# Patient Record
Sex: Female | Born: 1960 | Race: White | Hispanic: No | Marital: Married | Smoking: Former smoker
Health system: Southern US, Community
[De-identification: ages and names within clinical notes are randomized; demographics above are authoritative.]

## PROBLEM LIST (undated history)

## (undated) DIAGNOSIS — G629 Polyneuropathy, unspecified: Secondary | ICD-10-CM

## (undated) DIAGNOSIS — Z9181 History of falling: Secondary | ICD-10-CM

## (undated) DIAGNOSIS — T7840XA Allergy, unspecified, initial encounter: Secondary | ICD-10-CM

## (undated) DIAGNOSIS — F419 Anxiety disorder, unspecified: Secondary | ICD-10-CM

## (undated) DIAGNOSIS — H9190 Unspecified hearing loss, unspecified ear: Secondary | ICD-10-CM

## (undated) DIAGNOSIS — J449 Chronic obstructive pulmonary disease, unspecified: Secondary | ICD-10-CM

## (undated) HISTORY — DX: Polyneuropathy, unspecified: G62.9

## (undated) HISTORY — DX: Allergy, unspecified, initial encounter: T78.40XA

## (undated) HISTORY — PX: ABDOMINAL HYSTERECTOMY: SHX81

## (undated) HISTORY — DX: History of falling: Z91.81

## (undated) HISTORY — DX: Chronic obstructive pulmonary disease, unspecified: J44.9

## (undated) HISTORY — DX: Unspecified hearing loss, unspecified ear: H91.90

## (undated) HISTORY — DX: Anxiety disorder, unspecified: F41.9

---

## 2009-02-27 ENCOUNTER — Inpatient Hospital Stay: Payer: Self-pay | Admitting: Internal Medicine

## 2009-04-30 ENCOUNTER — Ambulatory Visit: Payer: Self-pay | Admitting: Internal Medicine

## 2009-12-01 ENCOUNTER — Emergency Department: Payer: Self-pay | Admitting: Emergency Medicine

## 2010-10-15 ENCOUNTER — Ambulatory Visit: Payer: Self-pay | Admitting: Internal Medicine

## 2011-04-05 ENCOUNTER — Inpatient Hospital Stay: Payer: Self-pay | Admitting: Internal Medicine

## 2011-04-07 DIAGNOSIS — R9431 Abnormal electrocardiogram [ECG] [EKG]: Secondary | ICD-10-CM

## 2012-04-30 ENCOUNTER — Emergency Department: Payer: Self-pay | Admitting: Emergency Medicine

## 2012-04-30 LAB — URINALYSIS, COMPLETE
Glucose,UR: NEGATIVE mg/dL (ref 0–75)
Ketone: NEGATIVE
Leukocyte Esterase: NEGATIVE
Nitrite: NEGATIVE
Protein: NEGATIVE
RBC,UR: <1 /HPF (ref 0–5)
WBC UR: NONE SEEN /HPF (ref 0–5)

## 2012-04-30 LAB — COMPREHENSIVE METABOLIC PANEL
Albumin: 3.7 g/dL (ref 3.4–5.0)
Anion Gap: 6 — ABNORMAL LOW (ref 7–16)
Bilirubin,Total: 0.3 mg/dL (ref 0.2–1.0)
Calcium, Total: 8.7 mg/dL (ref 8.5–10.1)
Chloride: 110 mmol/L — ABNORMAL HIGH (ref 98–107)
Co2: 29 mmol/L (ref 21–32)
Creatinine: 0.87 mg/dL (ref 0.60–1.30)
EGFR (African American): >60
EGFR (Non-African Amer.): >60
Glucose: 90 mg/dL (ref 65–99)
Osmolality: 289 (ref 275–301)
SGPT (ALT): 26 U/L (ref 12–78)
Sodium: 145 mmol/L (ref 136–145)

## 2012-04-30 LAB — CBC
HCT: 38.4 % (ref 35.0–47.0)
HGB: 13.3 g/dL (ref 12.0–16.0)
RDW: 13.1 % (ref 11.5–14.5)
WBC: 7.4 10*3/uL (ref 3.6–11.0)

## 2014-05-14 ENCOUNTER — Emergency Department: Payer: Self-pay | Admitting: Emergency Medicine

## 2014-05-14 LAB — CBC
HCT: 37.6 % (ref 35.0–47.0)
HGB: 12.5 g/dL (ref 12.0–16.0)
MCH: 30.8 pg (ref 26.0–34.0)
MCHC: 33.3 g/dL (ref 32.0–36.0)
MCV: 93 fL (ref 80–100)
Platelet: 273 10*3/uL (ref 150–440)
RBC: 4.07 10*6/uL (ref 3.80–5.20)
RDW: 13.2 % (ref 11.5–14.5)
WBC: 8.8 10*3/uL (ref 3.6–11.0)

## 2014-05-14 LAB — BASIC METABOLIC PANEL
Anion Gap: 6 — ABNORMAL LOW (ref 7–16)
BUN: 13 mg/dL (ref 7–18)
Calcium, Total: 8.4 mg/dL — ABNORMAL LOW (ref 8.5–10.1)
Chloride: 108 mmol/L — ABNORMAL HIGH (ref 98–107)
Co2: 27 mmol/L (ref 21–32)
Creatinine: 0.93 mg/dL (ref 0.60–1.30)
Glucose: 107 mg/dL — ABNORMAL HIGH (ref 65–99)
OSMOLALITY: 282 (ref 275–301)
Potassium: 3.8 mmol/L (ref 3.5–5.1)
Sodium: 141 mmol/L (ref 136–145)

## 2014-05-14 LAB — TROPONIN I: Troponin-I: <0.02 ng/mL

## 2016-01-20 ENCOUNTER — Ambulatory Visit: Payer: Medicare Other | Attending: Obstetrics and Gynecology | Admitting: Physical Therapy

## 2016-01-20 DIAGNOSIS — M6281 Muscle weakness (generalized): Secondary | ICD-10-CM | POA: Insufficient documentation

## 2016-01-20 DIAGNOSIS — R262 Difficulty in walking, not elsewhere classified: Secondary | ICD-10-CM | POA: Insufficient documentation

## 2016-01-20 NOTE — Therapy (Signed)
[place] [facility] MAIN [facility] SERVICES [address] [place], [place], [address] Phone: [phone]   Fax:  [phone]  Physical Therapy Evaluation  Patient Details  Name: Sandra Keller MRN: [id number] Date of Birth: 05/11/1961 Referring Provider: Dr. Galen Manila  Encounter Date: 01/20/2016      PT End of Session - 01/20/16 0936    Visit Number 1   Number of Visits 13   Date for PT Re-Evaluation 02/17/16   Authorization Type 1/10   PT Start Time 0803   PT Stop Time 0900   PT Time Calculation (min) 57 min   Equipment Utilized During Treatment Gait belt   Activity Tolerance Patient tolerated treatment well   Behavior During Therapy [facility] for tasks assessed/performed      History reviewed. No pertinent past medical history.  History reviewed. No pertinent past surgical history.  There were no vitals filed for this visit.       Subjective Assessment - 01/20/16 0930    Subjective Pt reports having bil knee pain (R>L) for the last few years. In 2013 she fell when going down stairs which is when they found the RLE neuropathy. She reports falling about a month ago when she was mowing her lawn and stepped into a hole and lost her balance. She states she has fallen about 4 times in the last 6 months due to losing her balance and states that her R ankle feels weak, causing her to not be able to catch herself. She states she has a bicycle that she would like to ride for exercise but due to her balance she is unable to. She reports having bil carpal tunnel. She does c/o back pain and intermittent shooting pains which she is seeing a chiropractor for and since her last visit she has not had any radicular symptoms. She denies BLE n/t besides the neuropathy in RLE. 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 Pt c/o decreased mobility due to the knee pain.   Pertinent History peripheral neuropathy RLE, time since onset,  husband's caretaker   Limitations Lifting;Standing;Walking   How long can you walk comfortably? 15-20 mins   Patient Stated Goals to decrease pain and be able to exercise   Currently in Pain? Yes   Pain Score 5    Pain Location Knee   Pain Orientation Right;Left   Pain Descriptors / Indicators Aching   Pain Type Chronic pain   Pain Onset More than a month ago   Aggravating Factors  standing, walking, lifting, stairs,    Pain Relieving Factors ibuprofen, rest            [facility] PT Assessment - 01/20/16 0001    Assessment   Medical Diagnosis chronic B knee pain, unstable gait   Referring Provider Dr. Galen Manila   Onset Date/Surgical Date 01/20/12   Prior Therapy yes; BLE weakness, balance   Precautions   Precautions Fall   Balance Screen   Has the patient fallen in the past 6 months Yes   How many times? 4   Has the patient had a decrease in activity level because of a fear of falling?  Yes   Is the patient reluctant to leave their home because of a fear of falling?  Yes   Home Environment   Living Environment Private residence   Living Arrangements Spouse/significant other   Available Help at Discharge Family   Type of Home House   Home  Access Ramped entrance   Home Layout One level   Home Equipment Walker - 2 wheels;Cane - single point;Grab bars - toilet;Tub bench;Toilet riser;Bedside commode   Prior Function   Level of Independence Independent   Vocation On disability   Leisure ride bicycle,    Balance   Balance Assessed Yes   Standardized Balance Assessment   Standardized Balance Assessment Berg Balance Test;Five Times Sit to Stand;10 meter walk test   Berg Balance Test   Sit to Stand Able to stand without using hands and stabilize independently   Standing Unsupported Able to stand safely 2 minutes   Sitting with Back Unsupported but Feet Supported on Floor or Stool Able to sit safely and securely 2 minutes   Stand to Sit Sits safely with minimal use of hands    Transfers Able to transfer safely, minor use of hands   Standing Unsupported with Eyes Closed Able to stand 10 seconds safely   Standing Ubsupported with Feet Together Able to place feet together independently and stand 1 minute safely   From Standing, Reach Forward with Outstretched Arm Can reach forward >12 cm safely (5")   From Standing Position, Pick up Object from Floor Able to pick up shoe safely and easily   From Standing Position, Turn to Look Behind Over each Shoulder Looks behind from both sides and weight shifts well   Turn 360 Degrees Able to turn 360 degrees safely in 4 seconds or less   Standing Unsupported, Alternately Place Feet on Step/Stool Able to stand independently and safely and complete 8 steps in 20 seconds   Standing Unsupported, One Foot in Front Able to place foot tandem independently and hold 30 seconds   Standing on One Leg Able to lift leg independently and hold 5-10 seconds   Total Score 54       REFLEXES L3: 2+ LLE; 1+RLE S1-2: diminished BLE  SENSATION WNL LLE Light touch diminished RLE, L3-S2  AROM WNL BLE  MMT (on 0-5/5 scale)  Left Right  Hip flexion 4+ 4-  Hip extension 3 3  Hip IR/ER 4 / 4- 3+ / 3+  Hip abduction 4- 3+  Hip adduction 4- 4-  Knee flexion 4 4-  Knee extension 4+ 4-  Ankle DF 4 4   FUNCTIONAL MOVEMENT Able to sit <> stand with minimal use of hands Stairs not assessed this date Pt demonstrated decreased bil knee flexion when picking object up off floor  SPECIAL TESTS Ely's: +BLE Ober's: -BLE; slight increased tightness and tenderness during test BLE McMurray's test: -BLE Valgus test: -BLE  GAIT Bil knee valgus, decreased hip extension, decreased pelvic rotation, trendelenberg throughout gait; able to ambulate with no assistive device  OBSERVATION/PALPATION Increased soft tissue restrictions to bil glute max, med, ITB, medial HS, gastroc/soleus, quads Increased tenderness to palpation of bil glute max, med, ITB,  medial HS Decreased patellar mobility throughout RLE, medial/lateral > superior/inferior; pain with superior mobs; lateral patellar tracking with quad contraction; pain with AP pressure to patella  Outcome measures: BERG: 54/56, low risk for falls 5 x sit to stand: 16.06s; subjects that are <60 YO and took >10 sec are more likely to have balance dysfunction 10MWT: 0.1423m/s; limited community ambulator        PT Education - 01/20/16 0936    Education provided Yes   Education Details exam findings, POC   Person(s) Educated Patient   Comprehension Verbalized understanding             PT  Long Term Goals - 01/20/16 0944    PT LONG TERM GOAL #1   Title pt will have increased LEFS score by >9 points to indicate improved overall function and decreased pain.   Baseline 10/80   Time 6   Period Weeks   Status New   PT LONG TERM GOAL #2   Title pt will be independent with HEP to maximize overall function and decrease risk of reinjury.   Time 6   Period Weeks   Status New   PT LONG TERM GOAL #3   Title pt will have increased by >0.15 m/s to demonstrate improved function and community ambulation.   Baseline 0.23m/s   Time 6   Period Weeks   Status New   PT LONG TERM GOAL #4   Title pt will have improved 5xsit to stand to <15sec to demonstrate improved strength and overall function.   Baseline 16.06s   Time 6   Period Weeks   Status New               Plan - 01/20/16 0936    Clinical Impression Statement pt is 55 YO F with c/o bil knee pain, R>L, RLE peripheral neuropathy, and balance deficits. pt demonstrated weakness, increased soft tissue restrictions and tenderness to palpation of bil hips, knees, and ankles, R>L. pt functional movements are diminished as demonstrated by improper body mechanics with squatting (increased hip flexion, decrease knee flexion), and subjective c/o pain with stairs and walking long distances. pt also had patellar mobility deficits and  lateral tracking of RLE. pt also has deficits in gait due to peripheral neuropathy and weakness. pt's BERG balance indicated she was at no increased risk for falls, however, she has fallen frequently in the last 6 months. pt needs skilled PT intervention to address deficits in weakness, soft tissue restrictions, gait, and functional mobility in order to maximize overall function and decrease risk for falls.   Rehab Potential Fair   Clinical Impairments Affecting Rehab Potential comorbidities, time since onset, Bil knee pain   PT Frequency 2x / week   PT Duration 6 weeks   PT Treatment/Interventions ADLs/Self Care Home Management;Electrical Stimulation;Gait training;Stair training;Functional mobility training;Therapeutic activities;Therapeutic exercise;Balance training;Neuromuscular re-education;Patient/family education;Manual techniques;Passive range of motion;Dry needling;Taping   PT Next Visit Plan HEP, manual, strengthening   Consulted and Agree with Plan of Care Patient      Patient will benefit from skilled therapeutic intervention in order to improve the following deficits and impairments:  Abnormal gait, Decreased activity tolerance, Decreased endurance, Decreased mobility, Decreased strength, Difficulty walking, Impaired sensation, Increased muscle spasms, Improper body mechanics, Obesity, Pain, Postural dysfunction  Visit Diagnosis: Muscle weakness (generalized)  Difficulty in walking, not elsewhere classified     Problem List There are no active problems to display for this patient.  Jac Canavan, SPT  Jac Canavan 01/20/2016, 9:52 AM   This entire session was performed under direct supervision and direction of a licensed therapist/therapist assistant . I have personally read, edited and approve of the note as written.  Mindy Lucillie Garfinkel, PT, DPT  01/20/2016, 6:24 PM [phone]   [facility] MAIN [facility] SERVICES [address], [place], [address] Phone: [phone]   Fax:  [phone]  Name: ROSABELLA EDGIN MRN: [id number] Date of Birth: 08/09/1960

## 2016-01-22 ENCOUNTER — Ambulatory Visit: Payer: Medicare Other | Admitting: Physical Therapy

## 2016-01-22 DIAGNOSIS — M6281 Muscle weakness (generalized): Secondary | ICD-10-CM | POA: Diagnosis not present

## 2016-01-22 DIAGNOSIS — R262 Difficulty in walking, not elsewhere classified: Secondary | ICD-10-CM

## 2016-01-22 NOTE — Patient Instructions (Signed)
[email or web address] Standing hip abd, ext, march with RTB, 2x10, 1x/day  Mini squats, 2x10, 1x/day bil side stepping with RTB x 5 laps, 1x/day

## 2016-01-22 NOTE — Therapy (Signed)
[place] [facility] [address] [place], [place], [address] Phone: [phone]   Fax:  [phone]  Physical Therapy Treatment  Patient Details  Name: Sandra Keller MRN: [id number] Date of Birth: 1961-06-18 Referring Provider: Dr. Galen ManilaZeitler  Encounter Date: 01/22/2016      PT End of Session - 01/22/16 0813    Visit Number 2   Number of Visits 13   Date for PT Re-Evaluation 02/17/16   Authorization Type 2/10   PT Start Time 0805   PT Stop Time 0845   PT Time Calculation (min) 40 min   Equipment Utilized During Treatment Gait belt   Activity Tolerance Patient tolerated treatment well   Behavior [facility] for tasks assessed/performed      Past Medical History  Diagnosis Date  . Anxiety   . COPD (chronic obstructive pulmonary disease) (HCC)   . Allergy   . Peripheral neuropathy (HCC) RLE  . Hearing loss   . Risk for falls     History reviewed. No pertinent past surgical history.  There were no vitals filed for this visit.      Subjective Assessment - 01/22/16 0809    Subjective pt reports feeling a little stiff this morning after driving and sitting for a little while.    Pertinent History peripheral neuropathy RLE, time since onset, husband's caretaker   Limitations Lifting;Standing;Walking   How long can you walk comfortably? 15-20 mins   Patient Stated Goals to decrease pain and be able to exercise   Currently in Pain? Yes   Pain Score 8    Pain Location Knee   Pain Orientation Right;Left   Pain Descriptors / Indicators Stabbing   Pain Onset More than a month ago       Therex: HEP: standing hip abd, extension and march with RTB, 2x10 each; min verbal cues for proper technique Mini squats, 2x10; min verbal cues for proper technique Bil side stepping with RTB, x 5 laps in // bars; min verbal cues for proper technique Resisted walking 4-way, 7.5# x 3 laps each way Bil sidelying hip abd and  clamshells, 2x 10 each, min verbal cues for proper technique  Manual: L ITB IASTM with the stick x 8 min; pt reports decreased pain following       PT Education - 01/22/16 0813    Education provided Yes   Education Details HEP   Person(s) Educated Patient   Methods Explanation   Comprehension Verbalized understanding             PT Long Term Goals - 01/20/16 0944    PT LONG TERM GOAL #1   Title pt will have increased LEFS score by >9 points to indicate improved overall function and decreased pain.   Baseline 10/80   Time 6   Period Weeks   Status New   PT LONG TERM GOAL #2   Title pt will be independent with HEP to maximize overall function and decrease risk of reinjury.   Time 6   Period Weeks   Status New   PT LONG TERM GOAL #3   Title pt will have increased 10MWT by >0.15 m/s to demonstrate improved function and community ambulation.   Baseline 0.8540m/s   Time 6   Period Weeks   Status New   PT LONG TERM GOAL #4   Title pt will have improved 5xsit to stand to <15sec to demonstrate improved strength and overall function.   Baseline 16.06s  Time 6   Period Weeks   Status New               Plan - 01/22/16 8119    Clinical Impression Statement Pt reports to therapy with continued bil knee pain, L>R today. Pt tolerated LE strengthening exercises as well as manual techniques to L ITB, which pt responded well to and reported decreased pain following. Pt was given HEP of LE strengthening exercises. Pt continues to have deficits in strength and soft tissue restrictions and would benefit from R patellar mobs, soft tissue release of bil ITB and glutes. Pt needs continued skilled PT intervention to address remaining deficits in ordre to maximize overall function and decrease pain.   Rehab Potential Fair   Clinical Impairments Affecting Rehab Potential comorbidities, time since onset, Bil knee pain   PT Frequency 2x / week   PT Duration 6 weeks   PT  Treatment/Interventions ADLs/Self Care Home Management;Electrical Stimulation;Gait training;Stair training;Functional mobility training;Therapeutic activities;Therapeutic exercise;Balance training;Neuromuscular re-education;Patient/family education;Manual techniques;Passive range of motion;Dry needling;Taping   PT Next Visit Plan HEP, manual, strengthening   Consulted and Agree with Plan of Care Patient      Patient will benefit from skilled therapeutic intervention in order to improve the following deficits and impairments:  Abnormal gait, Decreased activity tolerance, Decreased endurance, Decreased mobility, Decreased strength, Difficulty walking, Impaired sensation, Increased muscle spasms, Improper body mechanics, Obesity, Pain, Postural dysfunction  Visit Diagnosis: Muscle weakness (generalized)  Difficulty in walking, not elsewhere classified     Problem List There are no active problems to display for this patient.  Jac Canavan, SPT  Jac Canavan 01/22/2016, 8:55 AM   This entire session was performed under direct supervision and direction of a licensed therapist/therapist assistant . I have personally read, edited and approve of the note as written.  Mindy Lucillie Garfinkel, PT, DPT  01/22/2016, 9:19 AM [phone]   [facility] MAIN [facility] [address] [place], [place], [address] Phone: [phone]   Fax:  [phone]  Name: Sandra Keller MRN: [id number] Date of Birth: [birth year]

## 2016-01-27 ENCOUNTER — Ambulatory Visit: Payer: Medicare Other | Admitting: Physical Therapy

## 2016-01-27 DIAGNOSIS — M6281 Muscle weakness (generalized): Secondary | ICD-10-CM | POA: Diagnosis not present

## 2016-01-27 DIAGNOSIS — R262 Difficulty in walking, not elsewhere classified: Secondary | ICD-10-CM

## 2016-01-27 NOTE — Therapy (Signed)
[place] [facility] MAIN [facility] SERVICES [address] [place], [place], [address] Phone: [phone]   Fax:  [phone]  Physical Therapy Treatment  Patient Details  Name: Sandra Keller MRN: [id number] Date of Birth: 1961-05-16 Referring Provider: Dr. Galen Manila  Encounter Date: 01/27/2016      PT End of Session - 01/27/16 0929    Visit Number 3   Number of Visits 13   Date for PT Re-Evaluation 02/17/16   Authorization Type 3/10   PT Start Time 0905   PT Stop Time 0945   PT Time Calculation (min) 40 min   Equipment Utilized During Treatment Gait belt   Activity Tolerance Patient tolerated treatment well   Behavior During Therapy [facility] for tasks assessed/performed      Past Medical History:  Diagnosis Date  . Allergy   . Anxiety   . COPD (chronic obstructive pulmonary disease) (HCC)   . Hearing loss   . Peripheral neuropathy (HCC) RLE  . Risk for falls     History reviewed. No pertinent surgical history.  There were no vitals filed for this visit.      Subjective Assessment - 01/27/16 0927    Subjective pt reports she felt good over the weekend but that both her knees are bothering her this morning, L>R.   Pertinent History peripheral neuropathy RLE, time since onset, husband's caretaker   Limitations Lifting;Standing;Walking   How long can you walk comfortably? 15-20 mins   Patient Stated Goals to decrease pain and be able to exercise   Currently in Pain? Yes   Pain Score 8    Pain Location Knee   Pain Orientation Right;Left   Pain Onset More than a month ago        Manual:  IASTM with the stick, bil ITB, VMO x 20 min; increased restrictions and pain (L>R); decreased pain to 2/10 following session Grade 3 R patellar mobs, superior and medially, 3x30 sec bouts each  Therex: Bil quad stretch in prone, 3x30 sec each; min verbal cues for proper technique Bil sidelying hip abd 3x10, and clamshells 2 x10 each; min verbal cues  for proper technique Bridging with 3 sec hold at top, 2x10; min verbal cues for proper technique         PT Education - 01/27/16 0928    Education provided Yes   Education Details HEP, exercise technique   Person(s) Educated Patient   Methods Explanation   Comprehension Verbalized understanding             PT Long Term Goals - 01/20/16 0944      PT LONG TERM GOAL #1   Title pt will have increased LEFS score by >9 points to indicate improved overall function and decreased pain.   Baseline 10/80   Time 6   Period Weeks   Status New     PT LONG TERM GOAL #2   Title pt will be independent with HEP to maximize overall function and decrease risk of reinjury.   Time 6   Period Weeks   Status New     PT LONG TERM GOAL #3   Title pt will have increased by >0.15 m/s to demonstrate improved function and community ambulation.   Baseline 0.32m/s   Time 6   Period Weeks   Status New     PT LONG TERM GOAL #4   Title pt will have improved 5xsit to stand to <15sec to demonstrate improved strength and overall function.  Baseline 16.06s   Time 6   Period Weeks   Status New               Plan - 01/27/16 0930    Clinical Impression Statement Pt continues to make progress towards goals. She continues to present with increased soft tissue restrictions of bil glutes, quads, ITB and tolerated manual techniques well, reporting decreased pain following. She continues with deficits in BLE strength as well as demonstrated by difficulty with table exercises. Pt needs continued skilled PT to maximize overall function and decrease pain.   Rehab Potential Fair   Clinical Impairments Affecting Rehab Potential comorbidities, time since onset, Bil knee pain   PT Frequency 2x / week   PT Duration 6 weeks   PT Treatment/Interventions ADLs/Self Care Home Management;Electrical Stimulation;Gait training;Stair training;Functional mobility training;Therapeutic activities;Therapeutic  exercise;Balance training;Neuromuscular re-education;Patient/family education;Manual techniques;Passive range of motion;Dry needling;Taping   PT Next Visit Plan HEP, manual, strengthening   Consulted and Agree with Plan of Care Patient      Patient will benefit from skilled therapeutic intervention in order to improve the following deficits and impairments:  Abnormal gait, Decreased activity tolerance, Decreased endurance, Decreased mobility, Decreased strength, Difficulty walking, Impaired sensation, Increased muscle spasms, Improper body mechanics, Obesity, Pain, Postural dysfunction  Visit Diagnosis: Muscle weakness (generalized)  Difficulty in walking, not elsewhere classified     Problem List There are no active problems to display for this patient.  Jac Canavan, SPT This entire session was performed under direct supervision and direction of a licensed therapist/therapist assistant . I have personally read, edited and approve of the note as written.  Brice,Carrie PT, DPT 01/27/2016, 10:23 AM  [place] [facility] MAIN [facility] SERVICES [address] [place], [place], [address] Phone: [phone]   Fax:  [phone]  Name: Sandra Keller MRN: [id number] Date of Birth: [birth year]

## 2016-01-29 ENCOUNTER — Ambulatory Visit: Payer: Medicare Other

## 2016-01-29 DIAGNOSIS — M6281 Muscle weakness (generalized): Secondary | ICD-10-CM | POA: Diagnosis not present

## 2016-01-29 DIAGNOSIS — R262 Difficulty in walking, not elsewhere classified: Secondary | ICD-10-CM

## 2016-01-29 NOTE — Therapy (Signed)
[facility] MAIN [facility] SERVICES [address] [place], [place], [address] Phone: [phone]   Fax:  [phone]  Physical Therapy Treatment  Patient Details  Name: Sandra Keller MRN: [id number] Date of Birth: 11-Apr-1961 Referring Provider: Dr. Galen Manila  Encounter Date: 01/29/2016      PT End of Session - 01/29/16 0921    Visit Number 4   Number of Visits 13   Date for PT Re-Evaluation 02/17/16   Authorization Type 4/10   PT Start Time 0900   PT Stop Time 0942   PT Time Calculation (min) 42 min   Equipment Utilized During Treatment Gait belt   Activity Tolerance Patient tolerated treatment well   Behavior During Therapy [facility] for tasks assessed/performed      Past Medical History:  Diagnosis Date  . Allergy   . Anxiety   . COPD (chronic obstructive pulmonary disease) (HCC)   . Hearing loss   . Peripheral neuropathy (HCC) RLE  . Risk for falls     History reviewed. No pertinent surgical history.  There were no vitals filed for this visit.      Subjective Assessment - 01/29/16 0918    Subjective Pt reports being compliant with HEP. She states that her knee pain was increased this morning when she woke up but following her chiropractor visit this morning prior to therapy, her pain has decreased some.   Pertinent History peripheral neuropathy RLE, time since onset, husband's caretaker   Limitations Lifting;Standing;Walking   How long can you walk comfortably? 15-20 mins   Patient Stated Goals to decrease pain and be able to exercise   Currently in Pain? Yes   Pain Score 5    Pain Location Knee   Pain Orientation Right;Left   Pain Descriptors / Indicators Sore   Pain Onset More than a month ago        Manual: IASTM to bil ITB with the stick x 15 min; pt reported feeling looser and decreased pain following  Therex: Bil quad stretch in prone, 3x30 sec each Fwd/lat step ups with 6" step with no UE support 2x10 each; min  verbal/tactile cues to decrease pelvic rotation with lateral step ups, R>L Bil hip hikes on 4" step with no UE support 2x10 each; min tactile cues to decrease pelvic rotation with R>L; attempted to perform heel taps but increased pt knee pain;  Sit <> stand with RTB around knees for decreased knee valgus, 2x10        PT Education - 01/29/16 0920    Education provided Yes   Education Details exercise technique, decrease knee valgus   Person(s) Educated Patient   Methods Explanation   Comprehension Verbalized understanding             PT Long Term Goals - 01/20/16 0944      PT LONG TERM GOAL #1   Title pt will have increased LEFS score by >9 points to indicate improved overall function and decreased pain.   Baseline 10/80   Time 6   Period Weeks   Status New     PT LONG TERM GOAL #2   Title pt will be independent with HEP to maximize overall function and decrease risk of reinjury.   Time 6   Period Weeks   Status New     PT LONG TERM GOAL #3   Title pt will have increased by >0.15 m/s to demonstrate improved function and community ambulation.   Baseline 0.67m/s  Time 6   Period Weeks   Status New     PT LONG TERM GOAL #4   Title pt will have improved 5xsit to stand to <15sec to demonstrate improved strength and overall function.   Baseline 16.06s   Time 6   Period Weeks   Status New               Plan - 01/29/16 0933    Clinical Impression Statement Pt continuing to make progress as demonstrated by decreased soft tissue restrictions, and subjective reports that she is able to clear low threshold in her house more easily now. She continues to demonstrate fatigue R>L, as well as continued BLE weakness. Her R knee pain is main limiting factor throughout exercises. Pt needs continued skilled PT intervention to maximize overall function.   Rehab Potential Fair   Clinical Impairments Affecting Rehab Potential comorbidities, time since onset, Bil knee pain    PT Frequency 2x / week   PT Duration 6 weeks   PT Treatment/Interventions ADLs/Self Care Home Management;Electrical Stimulation;Gait training;Stair training;Functional mobility training;Therapeutic activities;Therapeutic exercise;Balance training;Neuromuscular re-education;Patient/family education;Manual techniques;Passive range of motion;Dry needling;Taping   PT Next Visit Plan HEP, manual, strengthening   Consulted and Agree with Plan of Care Patient      Patient will benefit from skilled therapeutic intervention in order to improve the following deficits and impairments:  Abnormal gait, Decreased activity tolerance, Decreased endurance, Decreased mobility, Decreased strength, Difficulty walking, Impaired sensation, Increased muscle spasms, Improper body mechanics, Obesity, Pain, Postural dysfunction  Visit Diagnosis: Muscle weakness (generalized)  Difficulty in walking, not elsewhere classified     Problem List There are no active problems to display for this patient.  Jac Canavan, SPT This entire session was performed under direct supervision and direction of a licensed therapist/therapist assistant . I have personally read, edited and approve of the note as written. Carlyon Shadow. Tortorici, PT, DPT [phone]   Tortorici,Ashley 01/29/2016, 2:03 PM  [facility] MAIN [facility] SERVICES [address] [place], [place], [address] Phone: [phone]   Fax:  [phone]  Name: Sandra Keller MRN: [id number] Date of Birth: March 22, 1961

## 2016-02-03 ENCOUNTER — Ambulatory Visit: Payer: Medicare Other

## 2016-02-03 DIAGNOSIS — R262 Difficulty in walking, not elsewhere classified: Secondary | ICD-10-CM

## 2016-02-03 DIAGNOSIS — M6281 Muscle weakness (generalized): Secondary | ICD-10-CM

## 2016-02-03 NOTE — Therapy (Signed)
[facility] MAIN [facility] SERVICES [address] [place], [place], [address] Phone: [phone]   Fax:  [phone]  Physical Therapy Treatment  Patient Details  Name: Sandra Keller MRN: [id number] Date of Birth: 1960-12-11 Referring Provider: Dr. Galen Manila  Encounter Date: 02/03/2016      PT End of Session - 02/03/16 0955    Visit Number 5   Number of Visits 13   Date for PT Re-Evaluation 02/17/16   Authorization Type 5/10   PT Start Time 0938   PT Stop Time 1017   PT Time Calculation (min) 39 min   Equipment Utilized During Treatment Gait belt   Activity Tolerance Patient tolerated treatment well   Behavior During Therapy [facility] for tasks assessed/performed      Past Medical History:  Diagnosis Date  . Allergy   . Anxiety   . COPD (chronic obstructive pulmonary disease) (HCC)   . Hearing loss   . Peripheral neuropathy (HCC) RLE  . Risk for falls     History reviewed. No pertinent surgical history.  There were no vitals filed for this visit.      Subjective Assessment - 02/03/16 0953    Subjective Pt reports increased pain medially on L knee during WB. Otherwise, pt states her pain has decreased overall.   Pertinent History peripheral neuropathy RLE, time since onset, husband's caretaker   Limitations Lifting;Standing;Walking   How long can you walk comfortably? 15-20 mins   Patient Stated Goals to decrease pain and be able to exercise   Currently in Pain? Yes   Pain Score 6    Pain Location Knee   Pain Orientation Left   Pain Descriptors / Indicators Sharp   Pain Onset More than a month ago       Manual: Cross friction and muscle stripping to medial L knee, adductors insertion point x 12 min; pt reported decreased pain following treatment  Therex: Bil quad stretch in prone, 3x30 each Bridging with 3 sec hold at top, 3x10; min verbal cues for proper technique Resisted walking 4-way x 10# x 3 laps each; SBA, increased  difficulty with L side stepping Wall slides and sit (x5 sec hold) with theraball, 2x10 each; min verbal cues to increased weight shift over LLE        PT Education - 02/03/16 0954    Education provided Yes   Education Details soft tissue restrictions, exercise technique   Person(s) Educated Patient   Methods Explanation   Comprehension Verbalized understanding             PT Long Term Goals - 01/20/16 0944      PT LONG TERM GOAL #1   Title pt will have increased LEFS score by >9 points to indicate improved overall function and decreased pain.   Baseline 10/80   Time 6   Period Weeks   Status New     PT LONG TERM GOAL #2   Title pt will be independent with HEP to maximize overall function and decrease risk of reinjury.   Time 6   Period Weeks   Status New     PT LONG TERM GOAL #3   Title pt will have increased by >0.15 m/s to demonstrate improved function and community ambulation.   Baseline 0.33m/s   Time 6   Period Weeks   Status New     PT LONG TERM GOAL #4   Title pt will have improved 5xsit to stand to <15sec to  demonstrate improved strength and overall function.   Baseline 16.06s   Time 6   Period Weeks   Status New               Plan - 02/03/16 1017    Clinical Impression Statement Pt presents to therapy today with increased pain in L knee medially which was addressed with manual therapy. Pt had increased soft tissue restrictions of adductors which were tender to palpation. She   reported that her pain decreased following treatment. Pt continues to have deficits in hip strength but is making improvements as evidenced by her tolerance for progressed strengthening exercises. She needs continued skilled PT intervention to address remaining deficits to maximize overall function.   Rehab Potential Fair   Clinical Impairments Affecting Rehab Potential comorbidities, time since onset, Bil knee pain   PT Frequency 2x / week   PT Duration 6 weeks    PT Treatment/Interventions ADLs/Self Care Home Management;Electrical Stimulation;Gait training;Stair training;Functional mobility training;Therapeutic activities;Therapeutic exercise;Balance training;Neuromuscular re-education;Patient/family education;Manual techniques;Passive range of motion;Dry needling;Taping   PT Next Visit Plan HEP, manual, strengthening   Consulted and Agree with Plan of Care Patient      Patient will benefit from skilled therapeutic intervention in order to improve the following deficits and impairments:  Abnormal gait, Decreased activity tolerance, Decreased endurance, Decreased mobility, Decreased strength, Difficulty walking, Impaired sensation, Increased muscle spasms, Improper body mechanics, Obesity, Pain, Postural dysfunction  Visit Diagnosis: Muscle weakness (generalized)  Difficulty in walking, not elsewhere classified     Problem List There are no active problems to display for this patient.  Jac Canavan, SPT This entire session was performed under direct supervision and direction of a licensed therapist/therapist assistant . I have personally read, edited and approve of the note as written. Carlyon Shadow. Tortorici, PT, DPT [phone]   Tortorici,Ashley 02/03/2016, 5:43 PM  [place] [facility] MAIN [facility] SERVICES [address] [place], [place], [address] Phone: [phone]   Fax:  [phone]  Name: EMILEY TEDESCO MRN: [id number] Date of Birth: 1961/03/13

## 2016-02-05 ENCOUNTER — Ambulatory Visit: Payer: Medicare Other | Attending: Obstetrics and Gynecology

## 2016-02-05 DIAGNOSIS — M6281 Muscle weakness (generalized): Secondary | ICD-10-CM | POA: Diagnosis not present

## 2016-02-05 DIAGNOSIS — R262 Difficulty in walking, not elsewhere classified: Secondary | ICD-10-CM | POA: Diagnosis present

## 2016-02-05 NOTE — Therapy (Signed)
[facility] MAIN [facility] SERVICES [address] [place], [place], [address] Phone: [phone]   Fax:  [phone]  Physical Therapy Treatment  Patient Details  Name: Sandra Keller MRN: [id number] Date of Birth: 07-Jan-1961 Referring Provider: Dr. Galen Manila  Encounter Date: 02/05/2016      PT End of Session - 02/05/16 0944    Visit Number 6   Number of Visits 13   Date for PT Re-Evaluation 02/17/16   Authorization Type 6/10   PT Start Time 0935   PT Stop Time 1018   PT Time Calculation (min) 43 min   Equipment Utilized During Treatment Gait belt   Activity Tolerance Patient tolerated treatment well   Behavior During Therapy [facility] for tasks assessed/performed      Past Medical History:  Diagnosis Date  . Allergy   . Anxiety   . COPD (chronic obstructive pulmonary disease) (HCC)   . Hearing loss   . Peripheral neuropathy (HCC) RLE  . Risk for falls     History reviewed. No pertinent surgical history.  There were no vitals filed for this visit.      Subjective Assessment - 02/05/16 0943    Subjective Pt reports that she feels her RLE has improved a lot. She reports continued medial knee pain on LLE this date but not as bad as last session.   Pertinent History peripheral neuropathy RLE, time since onset, husband's caretaker   Limitations Lifting;Standing;Walking   How long can you walk comfortably? 15-20 mins   Patient Stated Goals to decrease pain and be able to exercise   Currently in Pain? Yes   Pain Score 6    Pain Location Knee   Pain Orientation Left   Pain Descriptors / Indicators Sore   Pain Onset More than a month ago     Therex: Fwd/lat step ups on 6" step with airex, 2x10 each; min verbal cues to decrease pelvic rotation with L lateral step ups Lunges on long airex pad attempted but increased L knee pain so did not complete exercise Fwd/retro monster walks with RTB, 50ft x 5 laps; min verbal cues for proper  technique Mini squats with 7# DB, 2x10; pt reported no knee pain; min verbal cues for proper technique Sit <> stand with eccentric lowering (5 sec) with 7# DB, 2x10; min verbal cues for proper technique  Manual: Cross friction massage to medial L knee adductor insertion x 8 min; pt reported decreased pain and stiffness following treatment      PT Education - 02/05/16 0944    Education provided Yes   Education Details exercise technique, HEP   Person(s) Educated Patient   Methods Explanation   Comprehension Verbalized understanding             PT Long Term Goals - 01/20/16 0944      PT LONG TERM GOAL #1   Title pt will have increased LEFS score by >9 points to indicate improved overall function and decreased pain.   Baseline 10/80   Time 6   Period Weeks   Status New     PT LONG TERM GOAL #2   Title pt will be independent with HEP to maximize overall function and decrease risk of reinjury.   Time 6   Period Weeks   Status New     PT LONG TERM GOAL #3   Title pt will have increased by >0.15 m/s to demonstrate improved function and community ambulation.   Baseline 0.69m/s  Time 6   Period Weeks   Status New     PT LONG TERM GOAL #4   Title pt will have improved 5xsit to stand to <15sec to demonstrate improved strength and overall function.   Baseline 16.06s   Time 6   Period Weeks   Status New               Plan - 02/05/16 1215    Clinical Impression Statement Pt continuing to make progress towards goals as evidenced by decreased pain and improved BLE strength. Pt continues to c/o medial L knee pain, which was addressed with manual therapy following therex which helped alleviate pain. Pt reported joining a gym and asked which activities were appropriate for her to do. SPT educated pt that she could do/use anything that did not increase her back or BLE pain. Pt needs continued skilled PT intervention to maximize overall function.   Rehab Potential  Fair   Clinical Impairments Affecting Rehab Potential comorbidities, time since onset, Bil knee pain   PT Frequency 2x / week   PT Duration 6 weeks   PT Treatment/Interventions ADLs/Self Care Home Management;Electrical Stimulation;Gait training;Stair training;Functional mobility training;Therapeutic activities;Therapeutic exercise;Balance training;Neuromuscular re-education;Patient/family education;Manual techniques;Passive range of motion;Dry needling;Taping   PT Next Visit Plan HEP, manual, strengthening   Consulted and Agree with Plan of Care Patient      Patient will benefit from skilled therapeutic intervention in order to improve the following deficits and impairments:  Abnormal gait, Decreased activity tolerance, Decreased endurance, Decreased mobility, Decreased strength, Difficulty walking, Impaired sensation, Increased muscle spasms, Improper body mechanics, Obesity, Pain, Postural dysfunction  Visit Diagnosis: Muscle weakness (generalized)  Difficulty in walking, not elsewhere classified     Problem List There are no active problems to display for this patient.  Sandra Keller, SPT This entire session was performed under direct supervision and direction of a licensed therapist/therapist assistant . I have personally read, edited and approve of the note as written. Sandra Keller, PT, DPT [phone]  Keller,Sandra Keller 02/05/2016, 3:17 PM  [place] [facility] MAIN [facility] SERVICES [address] [place], [place], [address] Phone: [phone]   Fax:  [phone]  Name: Sandra Keller MRN: [id number] Date of Birth: 1961-06-14

## 2016-02-10 ENCOUNTER — Ambulatory Visit: Payer: Medicare Other

## 2016-02-10 DIAGNOSIS — R262 Difficulty in walking, not elsewhere classified: Secondary | ICD-10-CM

## 2016-02-10 DIAGNOSIS — M6281 Muscle weakness (generalized): Secondary | ICD-10-CM | POA: Diagnosis not present

## 2016-02-10 NOTE — Therapy (Signed)
[place] [facility] MAIN [facility] SERVICES [address] [place], [place], [address] Phone: [phone]   Fax:  [phone]  Physical Therapy Treatment  Patient Details  Name: Sandra Keller MRN: [id number] Date of Birth: October 27, 1960 Referring Provider: Dr. Galen Manila  Encounter Date: 02/10/2016      PT End of Session - 02/10/16 0959    Visit Number 7   Number of Visits 13   Date for PT Re-Evaluation 02/17/16   Authorization Type 6/10   PT Start Time 0939   PT Stop Time 1015   PT Time Calculation (min) 36 min   Equipment Utilized During Treatment Gait belt   Activity Tolerance Patient tolerated treatment well   Behavior During Therapy [facility] for tasks assessed/performed      Past Medical History:  Diagnosis Date  . Allergy   . Anxiety   . COPD (chronic obstructive pulmonary disease) (HCC)   . Hearing loss   . Peripheral neuropathy (HCC) RLE  . Risk for falls     History reviewed. No pertinent surgical history.  There were no vitals filed for this visit.      Subjective Assessment - 02/10/16 0958    Subjective Pt states she was partially compliant with HEP over the weekend and reports increased L knee pain this date.   Pertinent History peripheral neuropathy RLE, time since onset, husband's caretaker   Limitations Lifting;Standing;Walking   How long can you walk comfortably? 15-20 mins   Patient Stated Goals to decrease pain and be able to exercise   Currently in Pain? Yes   Pain Score 5    Pain Location Knee   Pain Orientation Left   Pain Descriptors / Indicators Sore   Pain Onset More than a month ago       Manual: Cross friction to medial L knee (adductors/HS insertion point); pt had increased soft tissue restrictions/taut band at medial knee; pt reported decreased pain following treatment; x 8 min  Therex: Hip hikes on stairs with BUE support, 3x10 each; min verbal/tactile cues for decrease pelvic rotation Resisted walking 4-way  x 12.5# x 3 laps each; pt demonstrated fatigue with R lateral walking Leg press 90#, 3x10 Wall slides and sits (5 sec hold) with bil 7# DB, 3x10 each; min verbal cues for proper technique; pt required increased rest breaks during wall sits        PT Education - 02/10/16 0959    Education provided Yes   Education Details exercise technique   Person(s) Educated Patient   Methods Explanation   Comprehension Verbalized understanding             PT Long Term Goals - 01/20/16 0944      PT LONG TERM GOAL #1   Title pt will have increased LEFS score by >9 points to indicate improved overall function and decreased pain.   Baseline 10/80   Time 6   Period Weeks   Status New     PT LONG TERM GOAL #2   Title pt will be independent with HEP to maximize overall function and decrease risk of reinjury.   Time 6   Period Weeks   Status New     PT LONG TERM GOAL #3   Title pt will have increased by >0.15 m/s to demonstrate improved function and community ambulation.   Baseline 0.70m/s   Time 6   Period Weeks   Status New     PT LONG TERM GOAL #4  Title pt will have improved 5xsit to stand to <15sec to demonstrate improved strength and overall function.   Baseline 16.06s   Time 6   Period Weeks   Status New               Plan - 02/10/16 1011    Clinical Impression Statement Treatment session limited this date due to pt being late to appointment. Pt continues with c/o medial L knee pain which was addressed with manual therapy and pt reported decrease in symptoms. Pt continues to demonstrate deficits in BLE strength and endurance and needs continued skilled PT intervention to maximize overall function.   Rehab Potential Fair   Clinical Impairments Affecting Rehab Potential comorbidities, time since onset, Bil knee pain   PT Frequency 2x / week   PT Duration 6 weeks   PT Treatment/Interventions ADLs/Self Care Home Management;Electrical Stimulation;Gait  training;Stair training;Functional mobility training;Therapeutic activities;Therapeutic exercise;Balance training;Neuromuscular re-education;Patient/family education;Manual techniques;Passive range of motion;Dry needling;Taping   PT Next Visit Plan HEP, manual, strengthening   Consulted and Agree with Plan of Care Patient      Patient will benefit from skilled therapeutic intervention in order to improve the following deficits and impairments:  Abnormal gait, Decreased activity tolerance, Decreased endurance, Decreased mobility, Decreased strength, Difficulty walking, Impaired sensation, Increased muscle spasms, Improper body mechanics, Obesity, Pain, Postural dysfunction  Visit Diagnosis: Muscle weakness (generalized)  Difficulty in walking, not elsewhere classified     Problem List There are no active problems to display for this patient.  Jac CanavanBrooke Elvert Cumpton, SPT This entire session was performed under direct supervision and direction of a licensed therapist/therapist assistant . I have personally read, edited and approve of the note as written. Carlyon ShadowAshley C. Tortorici, PT, DPT [phone]  Tortorici,Ashley 02/10/2016, 5:04 PM  [facility] [facility]IN [facility] [address] [place], [place], [id number] Phone: [phone]   Fax:  [phone]  Name: Sandra Keller MRN: [id number] Date of Birth: 1960/08/06

## 2016-02-12 ENCOUNTER — Ambulatory Visit: Payer: Medicare Other

## 2016-02-12 DIAGNOSIS — R262 Difficulty in walking, not elsewhere classified: Secondary | ICD-10-CM

## 2016-02-12 DIAGNOSIS — M6281 Muscle weakness (generalized): Secondary | ICD-10-CM

## 2016-02-12 NOTE — Therapy (Signed)
[place] [facility] MAIN [facility] SERVICES [address] [place], [place], [address] Phone: [phone]   Fax:  [phone]  Physical Therapy Treatment  Patient Details  Name: Sandra Keller MRN: [id number] Date of Birth: 1960-11-07 Referring Provider: Dr. Galen Manila  Encounter Date: 02/12/2016      PT End of Session - 02/12/16 1004    Visit Number 8   Number of Visits 13   Date for PT Re-Evaluation 02/17/16   Authorization Type 7/10   PT Start Time 0933   PT Stop Time 1017   PT Time Calculation (min) 44 min   Equipment Utilized During Treatment Gait belt   Activity Tolerance Patient tolerated treatment well   Behavior During Therapy [facility] for tasks assessed/performed      Past Medical History:  Diagnosis Date  . Allergy   . Anxiety   . COPD (chronic obstructive pulmonary disease) (HCC)   . Hearing loss   . Peripheral neuropathy (HCC) RLE  . Risk for falls     History reviewed. No pertinent surgical history.  There were no vitals filed for this visit.      Subjective Assessment - 02/12/16 1002    Subjective Pt reports continued L knee pain but states that it is not as bad as last treatment session.   Pertinent History peripheral neuropathy RLE, time since onset, husband's caretaker   Limitations Lifting;Standing;Walking   How long can you walk comfortably? 15-20 mins   Patient Stated Goals to decrease pain and be able to exercise   Currently in Pain? Yes   Pain Score 3    Pain Location Knee   Pain Orientation Left   Pain Descriptors / Indicators Sore   Pain Onset More than a month ago      Therex: Leg press 100# 1x15, 120# 2x15 Fwd/retro monster walks with GTB, 36ftx 5 laps; min verbal cues for proper technique Bil SLS with contralateral iso hip abd (x 3 sec) with ball against wall(Captain Lequita Halt pose), 2x10 each; min-mod verbal cues for proper technique and to decrease trendelenberg, L > R; BUE support for steadiness Fwd/lat step  ups on 6" step with GTB around BLE and contralateral march, 3x10 each; min verbal cues for proper technique, SBA Bil SLS with contralateral OH press with 4# DB, 3x10 each, min verbal cues for proper technique, min tactile cues for decrease trendelenberg bil (L>R) Mini squats with bil 9# DB, 1x20; min verbal cues for proper technique       PT Education - 02/12/16 1003    Education provided Yes   Education Details exercise technique, exercising at the gym, self soft tissue release   Person(s) Educated Patient   Methods Explanation   Comprehension Verbalized understanding             PT Long Term Goals - 01/20/16 0944      PT LONG TERM GOAL #1   Title pt will have increased LEFS score by >9 points to indicate improved overall function and decreased pain.   Baseline 10/80   Time 6   Period Weeks   Status New     PT LONG TERM GOAL #2   Title pt will be independent with HEP to maximize overall function and decrease risk of reinjury.   Time 6   Period Weeks   Status New     PT LONG TERM GOAL #3   Title pt will have increased by >0.15 m/s to demonstrate improved function and community ambulation.  Baseline 0.1859m/s   Time 6   Period Weeks   Status New     PT LONG TERM GOAL #4   Title pt will have improved 5xsit to stand to <15sec to demonstrate improved strength and overall function.   Baseline 16.06s   Time 6   Period Weeks   Status New               Plan - 02/12/16 1019    Clinical Impression Statement pt continuing to make progress towards goals. She tolerated progressed hip strengthening well but continues to demonstrate bil glute max and med deficits in strength and endurace. Pt inquired about gym exercises again and SPT educated pt that she could perform any of the machine weights as long as it does not increase her back or knee pain.   Rehab Potential Fair   Clinical Impairments Affecting Rehab Potential comorbidities, time since onset, Bil knee pain    PT Frequency 2x / week   PT Duration 6 weeks   PT Treatment/Interventions ADLs/Self Care Home Management;Electrical Stimulation;Gait training;Stair training;Functional mobility training;Therapeutic activities;Therapeutic exercise;Balance training;Neuromuscular re-education;Patient/family education;Manual techniques;Passive range of motion;Dry needling;Taping   PT Next Visit Plan HEP, manual, strengthening   Consulted and Agree with Plan of Care Patient      Patient will benefit from skilled therapeutic intervention in order to improve the following deficits and impairments:  Abnormal gait, Decreased activity tolerance, Decreased endurance, Decreased mobility, Decreased strength, Difficulty walking, Impaired sensation, Increased muscle spasms, Improper body mechanics, Obesity, Pain, Postural dysfunction  Visit Diagnosis: Muscle weakness (generalized)  Difficulty in walking, not elsewhere classified     Problem List There are no active problems to display for this patient.  Jac CanavanBrooke Jeffry Vogelsang, SPT This entire session was performed under direct supervision and direction of a licensed therapist/therapist assistant . I have personally read, edited and approve of the note as written. Carlyon ShadowAshley C. Tortorici, PT, DPT [phone]  Tortorici,Ashley 02/12/2016, 4:00 PM  [place] [facility] [address] [place], [place], [address] Phone: [phone]   Fax:  [phone]  Name: Ileene PatrickSherrie D Perriello MRN: [id number] Date of Birth: [birth year]

## 2016-02-17 ENCOUNTER — Ambulatory Visit: Payer: Medicare Other

## 2016-02-19 ENCOUNTER — Ambulatory Visit: Payer: Medicare Other

## 2016-03-02 ENCOUNTER — Ambulatory Visit: Payer: Medicare Other

## 2016-03-02 DIAGNOSIS — M6281 Muscle weakness (generalized): Secondary | ICD-10-CM | POA: Diagnosis not present

## 2016-03-02 DIAGNOSIS — R262 Difficulty in walking, not elsewhere classified: Secondary | ICD-10-CM

## 2016-03-02 NOTE — Therapy (Signed)
[place] [facility] [address] [place], [place], [address] Phone: [phone]   Fax:  [phone]  Physical Therapy Treatment  Patient Details  Name: Sandra PatrickSherrie D Cothern MRN: [id number] Date of Birth: 1960/09/26 Referring Provider: Dr. Galen ManilaZeitler  Encounter Date: 03/02/2016      PT End of Session - 03/02/16 0955    Visit Number 9   Number of Visits 25   Date for PT Re-Evaluation 04/13/16   Authorization Type 1/10   PT Start Time 0948   PT Stop Time 1015   PT Time Calculation (min) 27 min   Equipment Utilized During Treatment Gait belt   Activity Tolerance Patient tolerated treatment well   Behavior [facility] Therapy [facility] for tasks assessed/performed      Past Medical History:  Diagnosis Date  . Allergy   . Anxiety   . COPD (chronic obstructive pulmonary disease) (HCC)   . Hearing loss   . Peripheral neuropathy (HCC) RLE  . Risk for falls     No past surgical history on file.  There were no vitals filed for this visit.      Subjective Assessment - 03/02/16 0950    Subjective Pt states that she was has continued having bilateral knee pain. She has not been coming to therapy because the pain has been so bad. "I pretty much stayed in bed for the last 2 weeks." Pt states that she stopped doing all her exercises because her pain was so bad. She received cortisone shots in her L knee last week which has considerably improved her pain.    Pertinent History peripheral neuropathy RLE, time since onset, husband's caretaker   Limitations Lifting;Standing;Walking   How long can you walk comfortably? 15-20 mins   Patient Stated Goals to decrease pain and be able to exercise   Currently in Pain? Yes   Pain Score 1    Pain Location Knee   Pain Orientation Left   Pain Descriptors / Indicators Sore   Pain Type Chronic pain   Pain Onset More than a month ago   Aggravating Factors  standing, walking, lifting, stairs   Pain Relieving  Factors ibuprofen, rest            [facility] PT Assessment - 03/02/16 0001      ROM / Strength   AROM / PROM / Strength Strength     Strength   Strength Assessment Site Hip;Knee;Ankle   Right/Left Hip Right;Left   Right Hip Flexion 4/5   Right Hip Extension 4-/5   Right Hip External Rotation  4-/5   Right Hip Internal Rotation 4-/5   Right Hip ABduction 4-/5   Right Hip ADduction 4-/5   Left Hip Flexion 4/5   Left Hip Extension 4-/5   Left Hip External Rotation 4-/5   Left Hip Internal Rotation 4-/5   Left Hip ABduction 4-/5   Left Hip ADduction 4-/5   Right/Left Knee Right;Left   Right Knee Flexion 4+/5   Right Knee Extension 5/5   Left Knee Flexion 4+/5   Left Knee Extension 5/5   Right/Left Ankle Right;Left   Right Ankle Dorsiflexion 4+/5   Left Ankle Dorsiflexion 4+/5        TREATMENT  Ther-ex Scored and discussed LEFS results; Performed 5TSTS and 10MWT with patient; Manual muscle testing of LE (see above); Reviewed HEP with patient; Updated goals and discussed plan of care with patient;  PT Education - 03/02/16 [phone]    Education provided Yes   Education Details plan of care, goals updated, HEP reinforced   Person(s) Educated Patient   Methods Explanation   Comprehension Verbalized understanding             PT Long Term Goals - 03/02/16 0953      PT LONG TERM GOAL #1   Title pt will have increased LEFS score by >9 points to indicate improved overall function and decreased pain.   Baseline 10/80, 03/02/16: 55/80   Time 6   Period Weeks   Status Achieved     PT LONG TERM GOAL #2   Title pt will be independent with HEP to maximize overall function and decrease risk of reinjury.   Time 6   Period Weeks   Status On-going     PT LONG TERM GOAL #3   Title pt will have increased by >0.15 m/s to demonstrate improved function and community ambulation.   Baseline 0.30m/s, 03/02/16: 1.16 m/s   Time 6    Period Weeks   Status Achieved     PT LONG TERM GOAL #4   Title pt will have improved 5xsit to stand to <15sec to demonstrate improved strength and overall function.   Baseline 16.06s, 03/02/16: 10.07 seconds;   Time 6   Period Weeks   Status Achieved     PT LONG TERM GOAL #5   Title pt will have increased LEFS score by >9 points to indicate improved overall function and decreased pain.   Baseline 03/02/16: 55/80   Time 6   Period Weeks   Status New     Additional Long Term Goals   Additional Long Term Goals Yes     PT LONG TERM GOAL #6   Title Pt will improve bilateral hip strength by at least 1/2 MMT grade in all directions in order to provide better stability for knee kinematics   Baseline 03/02/16: 4/5 flexion, 4-/5 abduction, adduction, IR/ER, and extension;   Time 6   Period Weeks   Status New               Plan - 03/02/16 9604    Clinical Impression Statement Pt reports she essentially placed herself on bedrest over the last 2 weeks due to severe knee pain. She received cortisone shots in her L knee which has significantly improved her pain. Patient demonstrates significant improvement in and 5TSTS tests. She continues to demonstrate considerable bilateral hip weakness. She would benefit from skilled PT services to address remaining deficits in order to continue improving knee pain and LE function.    Rehab Potential Fair   Clinical Impairments Affecting Rehab Potential comorbidities, time since onset, Bil knee pain   PT Frequency 2x / week   PT Duration 6 weeks   PT Treatment/Interventions ADLs/Self Care Home Management;Electrical Stimulation;Gait training;Stair training;Functional mobility training;Therapeutic activities;Therapeutic exercise;Balance training;Neuromuscular re-education;Patient/family education;Manual techniques;Passive range of motion;Dry needling;Taping   PT Next Visit Plan Review HEP (pt encouraged to bring print-outs), discuss gym-based  program, manual for hip mobility, LE pain-free strengthening   PT Home Exercise Plan Standing hip abd, ext, march with RTB, 2x10, 1x/day, Mini squats, 2x10, 1x/day, bil side stepping with RTB x 5 laps, 1x/day    Consulted and Agree with Plan of Care Patient      Patient will benefit from skilled therapeutic intervention in order to improve the following deficits and impairments:  Abnormal gait, Decreased activity tolerance, Decreased endurance, Decreased  mobility, Decreased strength, Difficulty walking, Impaired sensation, Increased muscle spasms, Improper body mechanics, Obesity, Pain, Postural dysfunction  Visit Diagnosis: Muscle weakness (generalized) - Plan: PT plan of care cert/re-cert  Difficulty in walking, not elsewhere classified - Plan: PT plan of care cert/re-cert       G-Codes - March 25, 2016 1051    Functional Assessment Tool Used LEFS, 10 meter walk, 5x STS, MMT, clinical judgement   Functional Limitation Mobility: Walking and moving around   Mobility: Walking and Moving Around Current Status (Z6109) At least 20 percent but less than 40 percent impaired, limited or restricted   Mobility: Walking and Moving Around Goal Status (U0454) At least 1 percent but less than 20 percent impaired, limited or restricted      Problem List There are no active problems to display for this patient.  Lynnea Maizes PT, DPT   Huprich,Jason 2016-03-25, 11:01 AM  [place] [facility] MAIN [facility] SERVICES [address] [place], [place], [address] Phone: [phone]   Fax:  [phone]  Name: CORABELLE SPACKMAN MRN: [id number] Date of Birth: Nov 21, 1960

## 2016-03-04 ENCOUNTER — Ambulatory Visit: Payer: Medicare Other | Admitting: Physical Therapy

## 2016-03-04 VITALS — BP 108/61 | HR 89

## 2016-03-04 DIAGNOSIS — M6281 Muscle weakness (generalized): Secondary | ICD-10-CM | POA: Diagnosis not present

## 2016-03-04 DIAGNOSIS — R262 Difficulty in walking, not elsewhere classified: Secondary | ICD-10-CM

## 2016-03-04 NOTE — Therapy (Signed)
[facility] [address] [place], [place], [address] Phone: [phone]   Fax:  [phone]  Physical Therapy Treatment  Patient Details  Name: Sandra Keller MRN: [id number] Date of Birth: 08-May-1961 Referring Provider: Dr. Galen ManilaZeitler  Encounter Date: 03/04/2016      PT End of Session - 03/04/16 1034    Visit Number 10   Number of Visits 25   Date for PT Re-Evaluation 04/13/16   Authorization Type 2/10   PT Start Time 0942   PT Stop Time 1031   PT Time Calculation (min) 49 min   Activity Tolerance Patient tolerated treatment well   Behavior During Therapy [facility] for tasks assessed/performed      Past Medical History:  Diagnosis Date  . Allergy   . Anxiety   . COPD (chronic obstructive pulmonary disease) (HCC)   . Hearing loss   . Peripheral neuropathy (HCC) RLE  . Risk for falls     No past surgical history on file.  Vitals:   03/04/16 0951  BP: 108/61  Pulse: 89  SpO2: 97%        Subjective Assessment - 03/04/16 0949    Subjective Pt says her exercises at home are going well and is completing her HEP 1x/day.  Denies any pain currently.  Forgot to bring her HEP handouts in today but will bring them next session.  Pt is thinking about returning to [employer], say she does hip abd/add, leg, and the bicycle.   Pertinent History peripheral neuropathy RLE, time since onset, husband's caretaker   Limitations Lifting;Standing;Walking   How long can you walk comfortably? 15-20 mins   Patient Stated Goals to decrease pain and be able to exercise   Pain Onset More than a month ago      TREATMENT  Hooklying hip ER with RTB around knees 3x10  Sit<>stand with eccentric lowering with manual medial glide to patella (painful without manual assist) 2x10  Bridges with RTB around knees and cues for glute activation and even pelvis, 2x10  Sideways/forward/backward walking with RTB around knees with cues for slight  knee bend, 7510ft x2 each  Practiced exercises pt completes at gym (below):  Leg press 30# 1x10, 45# 1x10, 60# 2x10, cues for technique to not lock out knees into full extension  Forward/side stepping up to 8"step 2x10 each direction        PT Education - 03/04/16 0956    Education provided Yes   Education Details Exercise technique and purpose of each exercise.  Instructed pt to complete 1/2 of the exercises she typically does at the gym and assess knee soreness/pain after.   Person(s) Educated Patient   Methods Explanation;Verbal cues;Tactile cues   Comprehension Verbalized understanding;Returned demonstration             PT Long Term Goals - 03/02/16 0953      PT LONG TERM GOAL #1   Title pt will have increased LEFS score by >9 points to indicate improved overall function and decreased pain.   Baseline 10/80, 03/02/16: 55/80   Time 6   Period Weeks   Status Achieved     PT LONG TERM GOAL #2   Title pt will be independent with HEP to maximize overall function and decrease risk of reinjury.   Time 6   Period Weeks   Status On-going     PT LONG TERM GOAL #3   Title pt will have increased 10MWT by >0.15 m/s  to demonstrate improved function and community ambulation.   Baseline 0.7m/s, 03/02/16: 1.16 m/s   Time 6   Period Weeks   Status Achieved     PT LONG TERM GOAL #4   Title pt will have improved 5xsit to stand to <15sec to demonstrate improved strength and overall function.   Baseline 16.06s, 03/02/16: 10.07 seconds;   Time 6   Period Weeks   Status Achieved     PT LONG TERM GOAL #5   Title pt will have increased LEFS score by >9 points to indicate improved overall function and decreased pain.   Baseline 03/02/16: 55/80   Time 6   Period Weeks   Status New     Additional Long Term Goals   Additional Long Term Goals Yes     PT LONG TERM GOAL #6   Title Pt will improve bilateral hip strength by at least 1/2 MMT grade in all directions in order to provide  better stability for knee kinematics   Baseline 03/02/16: 4/5 flexion, 4-/5 abduction, adduction, IR/ER, and extension;   Time 6   Period Weeks   Status New               Plan - 03/04/16 1003    Clinical Impression Statement Discussed with pt the importance of easing into her routine at the gym and to assess for any soreness/pain after each gym session.  She was very motivated today and completed all exercises without pain. Pt will benefit from Bil LE strengthening, manual, and neuromuscular rehab interventions to advance gains already made with PT.   Rehab Potential Fair   Clinical Impairments Affecting Rehab Potential comorbidities, time since onset, Bil knee pain   PT Frequency 2x / week   PT Duration 6 weeks   PT Treatment/Interventions ADLs/Self Care Home Management;Electrical Stimulation;Gait training;Stair training;Functional mobility training;Therapeutic activities;Therapeutic exercise;Balance training;Neuromuscular re-education;Patient/family education;Manual techniques;Passive range of motion;Dry needling;Taping   PT Next Visit Plan Review HEP (pt encouraged to bring print-outs), discuss return to gym, manual for hip mobility, LE pain-free strengthening   PT Home Exercise Plan Standing hip abd, ext, march with RTB, 2x10, 1x/day, Mini squats, 2x10, 1x/day, bil side stepping with RTB x 5 laps, 1x/day    Consulted and Agree with Plan of Care Patient      Patient will benefit from skilled therapeutic intervention in order to improve the following deficits and impairments:  Abnormal gait, Decreased activity tolerance, Decreased endurance, Decreased mobility, Decreased strength, Difficulty walking, Impaired sensation, Increased muscle spasms, Improper body mechanics, Obesity, Pain, Postural dysfunction  Visit Diagnosis: Muscle weakness (generalized)  Difficulty in walking, not elsewhere classified     Problem List There are no active problems to display for this  patient.  Encarnacion Chu PT, DPT 03/04/2016, 10:36 AM  [place] [facility] MAIN [facility] SERVICES [address] [place], [place], [address] Phone: [phone]   Fax:  [phone]  Name: Sandra Keller MRN: [id number] Date of Birth: [birth year]

## 2016-03-11 ENCOUNTER — Ambulatory Visit: Payer: Medicare Other | Attending: Obstetrics and Gynecology | Admitting: Physical Therapy

## 2016-03-11 VITALS — BP 112/59 | HR 96

## 2016-03-11 DIAGNOSIS — R262 Difficulty in walking, not elsewhere classified: Secondary | ICD-10-CM | POA: Diagnosis present

## 2016-03-11 DIAGNOSIS — M6281 Muscle weakness (generalized): Secondary | ICD-10-CM | POA: Insufficient documentation

## 2016-03-11 NOTE — Therapy (Signed)
[facility] MAIN [facility] SERVICES [address] [place], [place], [address] Phone: [phone]   Fax:  [phone]  Physical Therapy Treatment  Patient Details  Name: Sandra Keller MRN: [id number] Date of Birth: [birth year] Referring Provider: Dr. Galen Manila  Encounter Date: 03/11/2016      PT End of Session - 03/11/16 1021    Visit Number 11   Number of Visits 25   Date for PT Re-Evaluation 04/13/16   Authorization Type 3/10   PT Start Time 0930   PT Stop Time 1016   PT Time Calculation (min) 46 min   Activity Tolerance Patient tolerated treatment well   Behavior During Therapy [facility] for tasks assessed/performed      Past Medical History:  Diagnosis Date  . Allergy   . Anxiety   . COPD (chronic obstructive pulmonary disease) (HCC)   . Hearing loss   . Peripheral neuropathy (HCC) RLE  . Risk for falls     History reviewed. No pertinent surgical history.  Vitals:   03/11/16 0935  BP: (!) 112/59  Pulse: 96  SpO2: 97%        Subjective Assessment - 03/11/16 0937    Subjective Pt arrived 15 minutes late.  Pt has not had time to go to the gym as time spent caregiving for her husband has increased over the past week.  She reports she has been doing her HEP every day or every other day.  Pt has brought in her HEP to review today.  Has been going on 25 minute walks with her dogs without pain and was encouraged that she walked from parking lot to appointment without pain.   Pertinent History peripheral neuropathy RLE, time since onset, husband's caretaker   Limitations Lifting;Standing;Walking   How long can you walk comfortably? 25 mins   Patient Stated Goals to decrease pain and be able to exercise   Currently in Pain? No/denies   Pain Onset --       TREATMENT   Therapeutic Exercise  Sit<>stand with eccentric lowering with RTB around knees 2x10  Sideways/forward/backward walking with RTB around knees with cues for slight knee  bend, 20ft x3 each  Standing hip abduction and hip extension with RTB around ankles 2x10 each side. Cues for posture with hip extension exercise to prevent lumbar lordosis.  Hooklying hip abd/ER with RTB around knees 2x10 with cues for glute activation   Manual:  Assessed Bil hip PROM F, IR, ER and noted L hip IR ~20 deg.  Lateral grade II L hip mobilization with belt with slight improvement in IR PROM.   Pt denies pain throughout duration of session.          PT Education - 03/11/16 0940    Education provided Yes   Education Details Reviewed HEP, exercise technique.   Person(s) Educated Patient   Methods Explanation;Verbal cues   Comprehension Verbalized understanding             PT Long Term Goals - 03/02/16 0953      PT LONG TERM GOAL #1   Title pt will have increased LEFS score by >9 points to indicate improved overall function and decreased pain.   Baseline 10/80, 03/02/16: 55/80   Time 6   Period Weeks   Status Achieved     PT LONG TERM GOAL #2   Title pt will be independent with HEP to maximize overall function and decrease risk of reinjury.   Time 6  Period Weeks   Status On-going     PT LONG TERM GOAL #3   Title pt will have increased 10MWT by >0.15 m/s to demonstrate improved function and community ambulation.   Baseline 0.2220m/s, 03/02/16: 1.16 m/s   Time 6   Period Weeks   Status Achieved     PT LONG TERM GOAL #4   Title pt will have improved 5xsit to stand to <15sec to demonstrate improved strength and overall function.   Baseline 16.06s, 03/02/16: 10.07 seconds;   Time 6   Period Weeks   Status Achieved     PT LONG TERM GOAL #5   Title pt will have increased LEFS score by >9 points to indicate improved overall function and decreased pain.   Baseline 03/02/16: 55/80   Time 6   Period Weeks   Status New     Additional Long Term Goals   Additional Long Term Goals Yes     PT LONG TERM GOAL #6   Title Pt will improve bilateral hip strength by  at least 1/2 MMT grade in all directions in order to provide better stability for knee kinematics   Baseline 03/02/16: 4/5 flexion, 4-/5 abduction, adduction, IR/ER, and extension;   Time 6   Period Weeks   Status New               Plan - 03/11/16 [id number]    Clinical Impression Statement Reviewed HEP with pt and pt denied any pain thorughout duration of session.  Has been completing her exercises at home and believes she is improving with PT and is able to walk 25 minutes without pain.  Noted to have decreased L hip IR compared to R and hip mobilization was performed today.  She tolerated all interventions well this date and denied pain.   Rehab Potential Fair   Clinical Impairments Affecting Rehab Potential comorbidities, time since onset, Bil knee pain   PT Frequency 2x / week   PT Duration 6 weeks   PT Treatment/Interventions ADLs/Self Care Home Management;Electrical Stimulation;Gait training;Stair training;Functional mobility training;Therapeutic activities;Therapeutic exercise;Balance training;Neuromuscular re-education;Patient/family education;Manual techniques;Passive range of motion;Dry needling;Taping   PT Next Visit Plan Review HEP (pt encouraged to bring print-outs), discuss return to gym, manual for hip mobility, LE pain-free strengthening   PT Home Exercise Plan Standing hip abd, ext, march with RTB, 2x10, 1x/day, Mini squats, 2x10, 1x/day, bil side stepping/forward/backward with RTB x 5 laps, 1x/day    Consulted and Agree with Plan of Care Patient      Patient will benefit from skilled therapeutic intervention in order to improve the following deficits and impairments:  Abnormal gait, Decreased activity tolerance, Decreased endurance, Decreased mobility, Decreased strength, Difficulty walking, Impaired sensation, Increased muscle spasms, Improper body mechanics, Obesity, Pain, Postural dysfunction  Visit Diagnosis: Muscle weakness (generalized)  Difficulty in walking, not  elsewhere classified     Problem List There are no active problems to display for this patient.   Encarnacion ChuAshley Akhila Mahnken PT, DPT 03/11/2016, 10:25 AM  [facility] [address] , [place], [address] Phone: [phone]   Fax:  [phone]  Name: Ileene PatrickSherrie D Jeancharles MRN: [id number] Date of Birth: [birth year]

## 2016-03-17 ENCOUNTER — Ambulatory Visit: Payer: Medicare Other | Admitting: Physical Therapy

## 2016-03-17 ENCOUNTER — Encounter: Payer: Self-pay | Admitting: Physical Therapy

## 2016-03-17 VITALS — BP 117/58 | HR 84

## 2016-03-17 DIAGNOSIS — M6281 Muscle weakness (generalized): Secondary | ICD-10-CM | POA: Diagnosis not present

## 2016-03-17 DIAGNOSIS — R262 Difficulty in walking, not elsewhere classified: Secondary | ICD-10-CM

## 2016-03-17 NOTE — Therapy (Signed)
[facility] MAIN [facility] SERVICES [address] [place], [place], [address] Phone: [phone]   Fax:  [phone]  Physical Therapy Treatment  Patient Details  Name: Sandra Keller MRN: [id number] Date of Birth: 1960/09/27 Referring Provider: Dr. Galen Manila  Encounter Date: 03/17/2016      PT End of Session - 03/17/16 1112    Visit Number 12   Number of Visits 25   Date for PT Re-Evaluation 04/13/16   Authorization Type 4/10   PT Start Time 1040   PT Stop Time 1115   PT Time Calculation (min) 35 min   Activity Tolerance Patient tolerated treatment well   Behavior During Therapy [facility] for tasks assessed/performed      Past Medical History:  Diagnosis Date  . Allergy   . Anxiety   . COPD (chronic obstructive pulmonary disease) (HCC)   . Hearing loss   . Peripheral neuropathy (HCC) RLE  . Risk for falls     History reviewed. No pertinent surgical history.  Vitals:   03/17/16 1046  BP: (!) 117/58  Pulse: 84  SpO2: 99%        Subjective Assessment - 03/17/16 1043    Subjective Pt arrived 10 minutes late.  She continues to be consumed by her husband who is, per pt, requiring more assist at home than usual.  Says she has not had any pain since her last PT session.  Walked 1.1 miles around her sister's neighborhood over the past weekend without pain, with mild SOB.  Completing HEP every other day.  No complaints or concerns this date.     Pertinent History peripheral neuropathy RLE, time since onset, husband's caretaker   Limitations Lifting;Standing;Walking   How long can you walk comfortably? 25 minutes; 1.1 miles   Patient Stated Goals to decrease pain and be able to exercise   Currently in Pain? No/denies        TREATMENT  Hip AROM measured in degrees before joint mobilizations (L,R):  F: 110, 118  Abd: 37, 36  IR: 30, 41  ER: 28, 26   Manual:  Lateral grade II L hip mobilization with belt with slight improvement in IR  PROM.  L Hip IR AROM measured in degrees after joint mobilizations: 42   Therapeutic Exercise  Leg press 105# x10, 120# x10, 135# x10  Sit<>stand with eccentric lowering with RTB around knees 2x15  Hooklying hip abd/ER with RTB around knees 2x15 with cues for glute activation  Sideways/forward/backward walking with RTB around knees with cues for slight knee bend, 67ft x3 each  Standing hip abduction and hip extension with RTB around ankles 2x10 each side. Cues for posture with hip extension exercise to prevent lumbar lordosis.  Pt denies pain throughout duration of session.           PT Education - 03/17/16 1047    Education provided Yes   Education Details Strongly encouraged pt to complete HEP 5 days/wk.  Exercise technique. Core activation with exercises.   Person(s) Educated Patient   Methods Explanation;Demonstration;Tactile cues;Verbal cues   Comprehension Verbalized understanding;Returned demonstration             PT Long Term Goals - 03/02/16 0953      PT LONG TERM GOAL #1   Title pt will have increased LEFS score by >9 points to indicate improved overall function and decreased pain.   Baseline 10/80, 03/02/16: 55/80   Time 6   Period Weeks   Status Achieved  PT LONG TERM GOAL #2   Title pt will be independent with HEP to maximize overall function and decrease risk of reinjury.   Time 6   Period Weeks   Status On-going     PT LONG TERM GOAL #3   Title pt will have increased 10MWT by >0.15 m/s to demonstrate improved function and community ambulation.   Baseline 0.6022m/s, 03/02/16: 1.16 m/s   Time 6   Period Weeks   Status Achieved     PT LONG TERM GOAL #4   Title pt will have improved 5xsit to stand to <15sec to demonstrate improved strength and overall function.   Baseline 16.06s, 03/02/16: 10.07 seconds;   Time 6   Period Weeks   Status Achieved     PT LONG TERM GOAL #5   Title pt will have increased LEFS score by >9 points to indicate improved  overall function and decreased pain.   Baseline 03/02/16: 55/80   Time 6   Period Weeks   Status New     Additional Long Term Goals   Additional Long Term Goals Yes     PT LONG TERM GOAL #6   Title Pt will improve bilateral hip strength by at least 1/2 MMT grade in all directions in order to provide better stability for knee kinematics   Baseline 03/02/16: 4/5 flexion, 4-/5 abduction, adduction, IR/ER, and extension;   Time 6   Period Weeks   Status New               Plan - 03/17/16 1109    Clinical Impression Statement Pt has only been comleting HEP every other day so had discussion about pt completing HEP each day in addition to walking or going ot the gym for exercise.  Pt verbalized undertsanding of role of HEP for long term management and for improved strength and reduction of pain.  Will benefit from skilled PT to progress strengthening exercises.     Rehab Potential Fair   Clinical Impairments Affecting Rehab Potential comorbidities, time since onset, Bil knee pain   PT Frequency 2x / week   PT Duration 6 weeks   PT Treatment/Interventions ADLs/Self Care Home Management;Electrical Stimulation;Gait training;Stair training;Functional mobility training;Therapeutic activities;Therapeutic exercise;Balance training;Neuromuscular re-education;Patient/family education;Manual techniques;Passive range of motion;Dry needling;Taping   PT Next Visit Plan Review HEP (pt encouraged to bring print-outs), discuss return to gym, manual for hip mobility, LE pain-free strengthening   PT Home Exercise Plan Standing hip abd, ext, march with RTB, 2x10, 1x/day, Mini squats, 2x10, 1x/day, bil side stepping/forward/backward with RTB x 5 laps, 1x/day    Consulted and Agree with Plan of Care Patient      Patient will benefit from skilled therapeutic intervention in order to improve the following deficits and impairments:  Abnormal gait, Decreased activity tolerance, Decreased endurance, Decreased  mobility, Decreased strength, Difficulty walking, Impaired sensation, Increased muscle spasms, Improper body mechanics, Obesity, Pain, Postural dysfunction  Visit Diagnosis: Muscle weakness (generalized)  Difficulty in walking, not elsewhere classified     Problem List There are no active problems to display for this patient.  Encarnacion ChuAshley Abashian PT, DPT 03/17/2016, 11:16 AM  [facility] [address] [place], [place], [address] Phone: [phone]   Fax:  [phone]  Name: Ileene PatrickSherrie D Kissick MRN: [id number] Date of Birth: 06-Apr-1961

## 2016-03-19 ENCOUNTER — Ambulatory Visit: Payer: Medicare Other | Admitting: Physical Therapy

## 2016-03-19 ENCOUNTER — Encounter: Payer: Self-pay | Admitting: Physical Therapy

## 2016-03-19 VITALS — BP 109/53 | HR 83

## 2016-03-19 DIAGNOSIS — M6281 Muscle weakness (generalized): Secondary | ICD-10-CM | POA: Diagnosis not present

## 2016-03-19 DIAGNOSIS — R262 Difficulty in walking, not elsewhere classified: Secondary | ICD-10-CM

## 2016-03-19 NOTE — Therapy (Signed)
[facility] MAIN [facility] SERVICES [address] [place], [place], [address] Phone: [phone]   Fax:  [phone]  Physical Therapy Treatment  Patient Details  Name: Sandra Keller MRN: [id number] Date of Birth: 25-Apr-1961 Referring Provider: Dr. Galen Manila  Encounter Date: 03/19/2016      PT End of Session - 03/19/16 1223    Visit Number 13   Number of Visits 25   Date for PT Re-Evaluation 04/13/16   Authorization Type 5/10   PT Start Time 1030   PT Stop Time 1115   PT Time Calculation (min) 45 min   Activity Tolerance Patient tolerated treatment well   Behavior During Therapy [facility] for tasks assessed/performed      Past Medical History:  Diagnosis Date  . Allergy   . Anxiety   . COPD (chronic obstructive pulmonary disease) (HCC)   . Hearing loss   . Peripheral neuropathy (HCC) RLE  . Risk for falls     History reviewed. No pertinent surgical history.  Vitals:   03/19/16 1041  BP: (!) 109/53  Pulse: 83  SpO2: 97%        Subjective Assessment - 03/19/16 1038    Subjective Pt went for a 0.25 mile walk in addition to grocery shopping yesterday, also went for a walk the day before.  Denies pain while walking but reports some fatigue.  Has been completing her HEP each day.  Has not been to [employer].  Expresses she goes down steps sideways with step to pattern and this is the only activity she feels is a challenge.   Pertinent History peripheral neuropathy RLE, time since onset, husband's caretaker   Limitations Lifting;Standing;Walking   How long can you walk comfortably? 25 minutes; 1.1 miles   Patient Stated Goals to decrease pain and be able to exercise   Currently in Pain? No/denies       TREATMENT  Manual:  L hip IR AROM prior to manual: 35 deg  L hip IR AROM after manual: 40 deg  Lateral grade III L hip mobilization with belt, 3 bouts 30 seconds   Therapeutic Exercise: Sit<>stand with eccentric lowering with  RTB around knees 2x15  Ascneding/descending flight of steps with cues to slow down motion for glute control during descent, specifically stepping down with R foot. Pt reports 1/10 R knee pain at end of this exercise. Advised pt to ice her R knee when she gets home from appointment.  Hooklying hip abd/ER with RTB around knees 2x15 with cues for glute activation  Bridges with RTB around knees 2x10 with cues for glute activation  Resisted walking in [employer] machine,12.5 lbs 2 lengths forward/back/left/right              PT Education - 03/19/16 1040    Education provided Yes   Education Details Exercise Technique    Person(s) Educated Patient   Methods Explanation;Demonstration;Verbal cues   Comprehension Verbalized understanding;Returned demonstration             PT Long Term Goals - 03/02/16 0953      PT LONG TERM GOAL #1   Title pt will have increased LEFS score by >9 points to indicate improved overall function and decreased pain.   Baseline 10/80, 03/02/16: 55/80   Time 6   Period Weeks   Status Achieved     PT LONG TERM GOAL #2   Title pt will be independent with HEP to maximize overall function and decrease risk of reinjury.  Time 6   Period Weeks   Status On-going     PT LONG TERM GOAL #3   Title pt will have increased 10MWT by >0.15 m/s to demonstrate improved function and community ambulation.   Baseline 0.7010m/s, 03/02/16: 1.16 m/s   Time 6   Period Weeks   Status Achieved     PT LONG TERM GOAL #4   Title pt will have improved 5xsit to stand to <15sec to demonstrate improved strength and overall function.   Baseline 16.06s, 03/02/16: 10.07 seconds;   Time 6   Period Weeks   Status Achieved     PT LONG TERM GOAL #5   Title pt will have increased LEFS score by >9 points to indicate improved overall function and decreased pain.   Baseline 03/02/16: 55/80   Time 6   Period Weeks   Status New     Additional Long Term Goals   Additional Long Term Goals  Yes     PT LONG TERM GOAL #6   Title Pt will improve bilateral hip strength by at least 1/2 MMT grade in all directions in order to provide better stability for knee kinematics   Baseline 03/02/16: 4/5 flexion, 4-/5 abduction, adduction, IR/ER, and extension;   Time 6   Period Weeks   Status New               Plan - 03/19/16 1051    Clinical Impression Statement Encouraged pt to continue with HEP and walking/gym exercises for long term strengthening plan.  Pt is motivated and encouraged by progress she has made and the fact that she remains painfree.  At end of step down/stair exercise pt reports R knee pain 1/10 and was advised to ice her R knee when she gets home.  Enocuraged pt to take stairs into/out of home rather than ramp with focus on alternating pattern and control during descent.  Will benefit from skilled PT services to assist with transition to self managment once pt is appropriate for d/c.   Rehab Potential Fair   Clinical Impairments Affecting Rehab Potential comorbidities, time since onset, Bil knee pain   PT Frequency 2x / week   PT Duration 6 weeks   PT Treatment/Interventions ADLs/Self Care Home Management;Electrical Stimulation;Gait training;Stair training;Functional mobility training;Therapeutic activities;Therapeutic exercise;Balance training;Neuromuscular re-education;Patient/family education;Manual techniques;Passive range of motion;Dry needling;Taping   PT Next Visit Plan Discuss return to gym, manual for hip mobility, LE pain-free strengthening   PT Home Exercise Plan Standing hip abd, ext, march with RTB, 2x10, 1x/day, Mini squats, 2x10, 1x/day, bil side stepping/forward/backward with RTB x 5 laps, 1x/day    Consulted and Agree with Plan of Care Patient      Patient will benefit from skilled therapeutic intervention in order to improve the following deficits and impairments:  Abnormal gait, Decreased activity tolerance, Decreased endurance, Decreased mobility,  Decreased strength, Difficulty walking, Impaired sensation, Increased muscle spasms, Improper body mechanics, Obesity, Pain, Postural dysfunction  Visit Diagnosis: Muscle weakness (generalized)  Difficulty in walking, not elsewhere classified     Problem List There are no active problems to display for this patient.   Encarnacion ChuAshley Nancee Brownrigg PT, DPT 03/19/2016, 12:31 PM  [facility] [facility] [address] [place], [place], [address] Phone: [phone]   Fax:  [phone]  Name: Sandra Keller MRN: [id number] Date of Birth: 1961/06/23

## 2016-03-23 ENCOUNTER — Encounter: Payer: Self-pay | Admitting: Physical Therapy

## 2016-03-23 ENCOUNTER — Ambulatory Visit: Payer: Medicare Other | Admitting: Physical Therapy

## 2016-03-23 VITALS — BP 102/55 | HR 102

## 2016-03-23 DIAGNOSIS — M6281 Muscle weakness (generalized): Secondary | ICD-10-CM | POA: Diagnosis not present

## 2016-03-23 DIAGNOSIS — R262 Difficulty in walking, not elsewhere classified: Secondary | ICD-10-CM

## 2016-03-23 NOTE — Therapy (Signed)
[facility] [address] [place], [place], [address] Phone: [phone]   Fax:  [phone]  Physical Therapy Treatment  Patient Details  Name: Sandra PatrickSherrie D Aki MRN: [id number] Date of Birth: August 13, 1960 Referring Provider: Dr. Galen ManilaZeitler  Encounter Date: 03/23/2016      PT End of Session - 03/23/16 1022    Visit Number 14   Number of Visits 25   Date for PT Re-Evaluation 04/13/16   Authorization Type 6/10   PT Start Time 0940   PT Stop Time 1020   PT Time Calculation (min) 40 min   Activity Tolerance Patient tolerated treatment well   Behavior During Therapy [facility] for tasks assessed/performed      Past Medical History:  Diagnosis Date  . Allergy   . Anxiety   . COPD (chronic obstructive pulmonary disease) (HCC)   . Hearing loss   . Peripheral neuropathy (HCC) RLE  . Risk for falls     History reviewed. No pertinent surgical history.  Vitals:   03/23/16 0944  BP: (!) 102/55  Pulse: (!) 102  SpO2: 97%        Subjective Assessment - 03/23/16 0942    Subjective Pt arrived 10 minutes late.  Pt reports she has had minimal pain performing steps at home and has been icing and taking ibuprofen and pain has calmed down.  Instructed pt to use her ramp at home and take elevator in community to avoid intensifying her knee pain.  Has not gone to [employer] due to her min knee pain.  Pt walked in neighborhood 0.25 mile every other day with minimall "soreness" in her R knee.  Has been completing her HEP every other day.   Pertinent History peripheral neuropathy RLE, time since onset, husband's caretaker   Limitations Lifting;Standing;Walking   How long can you walk comfortably? 25 minutes; 1.1 miles   Patient Stated Goals to decrease pain and be able to exercise   Currently in Pain? No/denies       TREATMENT  Manual:  Lateral grade III L hip mobilization with belt, 3 bouts 30 seconds   Therapeutic Exercise:   Sit<>stand with eccentric lowering with RTB around knees 2x15  Hooklying hip abd/ER with RTB around knees 2x10 with 5 second holds and cues for glute activation  Bridges with RTB around knees 2x10 with 5 second holds and cues for glute activation  Resisted walking in [employer] machine,12.5 lbs 3 lengths forward/back/left/right  BLE leg press 120#, 3x10 with cues for control during eccentric phase and for glute activation  Denies pain throughout duration of session                PT Education - 03/23/16 0947    Education provided Yes   Education Details Exercise technique   Person(s) Educated Patient   Methods Explanation;Demonstration;Verbal cues   Comprehension Verbalized understanding;Returned demonstration             PT Long Term Goals - 03/02/16 0953      PT LONG TERM GOAL #1   Title pt will have increased LEFS score by >9 points to indicate improved overall function and decreased pain.   Baseline 10/80, 03/02/16: 55/80   Time 6   Period Weeks   Status Achieved     PT LONG TERM GOAL #2   Title pt will be independent with HEP to maximize overall function and decrease risk of reinjury.   Time 6   Period Weeks  Status On-going     PT LONG TERM GOAL #3   Title pt will have increased by >0.15 m/s to demonstrate improved function and community ambulation.   Baseline 0.37m/s, 03/02/16: 1.16 m/s   Time 6   Period Weeks   Status Achieved     PT LONG TERM GOAL #4   Title pt will have improved 5xsit to stand to <15sec to demonstrate improved strength and overall function.   Baseline 16.06s, 03/02/16: 10.07 seconds;   Time 6   Period Weeks   Status Achieved     PT LONG TERM GOAL #5   Title pt will have increased LEFS score by >9 points to indicate improved overall function and decreased pain.   Baseline 03/02/16: 55/80   Time 6   Period Weeks   Status New     Additional Long Term Goals   Additional Long Term Goals Yes     PT LONG TERM GOAL #6    Title Pt will improve bilateral hip strength by at least 1/2 MMT grade in all directions in order to provide better stability for knee kinematics   Baseline 03/02/16: 4/5 flexion, 4-/5 abduction, adduction, IR/ER, and extension;   Time 6   Period Weeks   Status New               Plan - 03/23/16 0949    Clinical Impression Statement Encouraged pt to complete HEP 4 days before next session to reach goal of completing her HEP Independently 5x/wk.  Pt tolerated all interventions well today without pain. Will benefit from progression of her functional strengthening program to decrease pain and improve QOL.   Rehab Potential Fair   Clinical Impairments Affecting Rehab Potential comorbidities, time since onset, Bil knee pain   PT Frequency 2x / week   PT Duration 6 weeks   PT Treatment/Interventions ADLs/Self Care Home Management;Electrical Stimulation;Gait training;Stair training;Functional mobility training;Therapeutic activities;Therapeutic exercise;Balance training;Neuromuscular re-education;Patient/family education;Manual techniques;Passive range of motion;Dry needling;Taping   PT Next Visit Plan Re-assess for d/c if appopriate.  Discuss return to gym, manual for hip mobility, LE pain-free strengthening.   PT Home Exercise Plan Standing hip abd, ext, march with RTB, 2x10, 1x/day, Mini squats, 2x10, 1x/day, bil side stepping/forward/backward with RTB x 5 laps, 1x/day    Consulted and Agree with Plan of Care Patient      Patient will benefit from skilled therapeutic intervention in order to improve the following deficits and impairments:  Abnormal gait, Decreased activity tolerance, Decreased endurance, Decreased mobility, Decreased strength, Difficulty walking, Impaired sensation, Increased muscle spasms, Improper body mechanics, Obesity, Pain, Postural dysfunction  Visit Diagnosis: Muscle weakness (generalized)  Difficulty in walking, not elsewhere classified     Problem  List There are no active problems to display for this patient.  Encarnacion Chu PT, DPT 03/23/2016, 10:26 AM  [place] [facility] MAIN [facility] SERVICES [address] [place], [place], [address] Phone: [phone]   Fax:  [phone]  Name: IZZABELLA BESSE MRN: [id number] Date of Birth: [birth year]

## 2016-03-30 ENCOUNTER — Ambulatory Visit: Payer: Medicare Other

## 2016-03-30 DIAGNOSIS — M6281 Muscle weakness (generalized): Secondary | ICD-10-CM

## 2016-03-30 DIAGNOSIS — R262 Difficulty in walking, not elsewhere classified: Secondary | ICD-10-CM

## 2016-03-30 NOTE — Therapy (Signed)
[place] MAIN [facility] SERVICES [address] [place], [place], [address] Phone: [phone]   Fax:  [phone]  Physical Therapy Treatment/Discharge Summary  Patient has been in therapy from 01/20/16 to 03/30/16 for 15 visits. All goals have been met and is to be discharged from therapy.   Patient Details  Name: Sandra Keller MRN: [id number] Date of Birth: 1960-09-10 Referring Provider: Dr. Sonda Rumble  Encounter Date: 03/30/2016      PT End of Session - 03/30/16 1010    Visit Number 15   Number of Visits 25   Date for PT Re-Evaluation 04/13/16   Authorization Type 7/10   PT Start Time 0937   PT Stop Time 1015   PT Time Calculation (min) 38 min   Activity Tolerance Patient tolerated treatment well   Behavior During Therapy [facility] for tasks assessed/performed      Past Medical History:  Diagnosis Date  . Allergy   . Anxiety   . COPD (chronic obstructive pulmonary disease) ([place])   . Hearing loss   . Peripheral neuropathy (HCC) RLE  . Risk for falls     History reviewed. No pertinent surgical history.  There were no vitals filed for this visit.      Subjective Assessment - 03/30/16 0942    Subjective Patient arrived 7 minutes late for appointment. Reports she's been performing her HEP at home and at [employer] without pain. Performed x4 since previous session and went to [employer] since last session performing treadmill and biking exercises for ~73mn each.    Pertinent History peripheral neuropathy RLE, time since onset, husband's caretaker   Limitations Lifting;Standing;Walking   How long can you walk comfortably? 25 minutes; 1.1 miles   Patient Stated Goals to decrease pain and be able to exercise   Currently in Pain? No/denies            [facility] - 03/30/16 0946      Strength   Strength Assessment Site Hip;Knee;Ankle   Right/Left Hip Right;Left   Right Hip Flexion 4+/5   Right Hip Extension 4+/5    Right Hip External Rotation  4/5   Right Hip Internal Rotation 4+/5   Right Hip ABduction 5/5   Right Hip ADduction 4+/5   Left Hip Flexion 4+/5   Left Hip Extension 4/5   Left Hip External Rotation 4-/5   Left Hip Internal Rotation 4+/5   Left Hip ABduction 4+/5   Left Hip ADduction 4+/5   Right Knee Flexion 5/5   Right Knee Extension 5/5   Left Knee Flexion 5/5   Left Knee Extension 5/5   Right Ankle Dorsiflexion 5/5   Left Ankle Dorsiflexion 5/5     TREATMENT   Special Tests: Elys: negative bilaterally  Outcome Measures: LEFS: 50/80   Therapeutic Exercise:  BLE leg press --135#, x20 with cues for control during eccentric phase and for glute activation  Denies pain throughout duration of session Monster walks with RTB - 6 x 130f Sit<>stand with eccentric lowering with RTB around knees 2x20          [facility] Adult PT Treatment/Exercise - 03/30/16 0946      Transfers   Five time sit to stand comments  14.28sec     Ambulation/Gait   Gait velocity 1.14 m/s                PT Education - 03/30/16 1008    Education provided Yes   Education  Details Educated on proper form and technique when performing exercises for HEP   Person(s) Educated Patient   Methods Explanation;Demonstration   Comprehension Verbalized understanding;Returned demonstration             PT Long Term Goals - 03/30/16 1014      PT LONG TERM GOAL #1   Title pt will have increased LEFS score by >9 points to indicate improved overall function and decreased pain.   Baseline 10/80, 03/02/16: 55/80 03/30/16: 50/80   Time 6   Period Weeks   Status Achieved     PT LONG TERM GOAL #2   Title pt will be independent with HEP to maximize overall function and decrease risk of reinjury.   Baseline independent with HEP performance and progression   Time 6   Period Weeks   Status Achieved     PT LONG TERM GOAL #3   Title pt will have increased 10MWT by >0.15 m/s to demonstrate improved  function and community ambulation.   Baseline 0.63ms, 03/02/16: 1.16 m/s 03/30/16: 1.14 m/s    Time 6   Period Weeks   Status Achieved     PT LONG TERM GOAL #4   Title pt will have improved 5xsit to stand to <15sec to demonstrate improved strength and overall function.   Baseline 16.06s, 03/02/16: 10.07 seconds; 03/30/16: 14.87   Time 6   Period Weeks   Status Achieved     PT LONG TERM GOAL #5   Title pt will have increased LEFS score by >9 points to indicate improved overall function and decreased pain.   Baseline 03/02/16: 55/80; 03/30/16: 50/80   Time 6   Period Weeks   Status Partially Met     PT LONG TERM GOAL #6   Title Pt will improve bilateral hip strength by at least 1/2 MMT grade in all directions in order to provide better stability for knee kinematics   Baseline 03/02/16: 4/5 flexion, 4-/5 abduction, adduction, IR/ER, and extension; 03/30/16: Improvement or maintainence of all MMT   Time 6   Period Weeks   Status Achieved               Plan - 03/30/16 1256    Clinical Impression Statement Patient demonstrates significant improvement in LE muscular strength, endurance, and decreased fall risk as indicated by improvements in LEFS, 151malk, 5xSTS, and MMT scores. Patient is independent with HEP and has met all long term goals Pt is to be discharged from physical therapy.    Rehab Potential Fair   Clinical Impairments Affecting Rehab Potential comorbidities, time since onset, Bil knee pain   PT Frequency 2x / week   PT Duration 6 weeks   PT Treatment/Interventions ADLs/Self Care Home Management;Electrical Stimulation;Gait training;Stair training;Functional mobility training;Therapeutic activities;Therapeutic exercise;Balance training;Neuromuscular re-education;Patient/family education;Manual techniques;Passive range of motion;Dry needling;Taping   PT Next Visit Plan discharge   PT Home Exercise Plan Standing hip abd, ext, march with RTB, 2x10, 1x/day, Mini squats, 2x10,  1x/day, bil side stepping/forward/backward with RTB x 5 laps, 1x/day    Consulted and Agree with Plan of Care Patient      Patient will benefit from skilled therapeutic intervention in order to improve the following deficits and impairments:  Abnormal gait, Decreased activity tolerance, Decreased endurance, Decreased mobility, Decreased strength, Difficulty walking, Impaired sensation, Increased muscle spasms, Improper body mechanics, Obesity, Pain, Postural dysfunction  Visit Diagnosis: Muscle weakness (generalized)  Difficulty in walking, not elsewhere classified       G-Codes -  03/30/16 1300    Functional Assessment Tool Used LEFS, 10 meter walk, 5x STS, MMT, clinical judgement   Functional Limitation Mobility: Walking and moving around   Mobility: Walking and Moving Around Current Status [phone]) At least 1 percent but less than 20 percent impaired, limited or restricted   Mobility: Walking and Moving Around Goal Status [phone]) At least 1 percent but less than 20 percent impaired, limited or restricted   Mobility: Walking and Moving Around Discharge Status [phone]) At least 1 percent but less than 20 percent impaired, limited or restricted      Problem List There are no active problems to display for this patient.   Blythe Stanford, PT DPT 03/30/2016, 1:03 PM  [place] MAIN [facility] SERVICES [address] [place], [place], [address] Phone: [phone]   Fax:  [phone]  Name: Sandra Keller MRN: [id number] Date of Birth: [birth year]

## 2018-06-18 ENCOUNTER — Emergency Department
Admission: EM | Admit: 2018-06-18 | Discharge: 2018-06-18 | Disposition: A | Discharge status: Home / Self Care | Payer: Medicare Other | Attending: Emergency Medicine | Admitting: Emergency Medicine

## 2018-06-18 ENCOUNTER — Other Ambulatory Visit: Payer: Self-pay

## 2018-06-18 ENCOUNTER — Encounter: Payer: Self-pay | Admitting: Emergency Medicine

## 2018-06-18 ENCOUNTER — Emergency Department: Payer: Medicare Other

## 2018-06-18 DIAGNOSIS — M7918 Myalgia, other site: Secondary | ICD-10-CM | POA: Insufficient documentation

## 2018-06-18 DIAGNOSIS — R51 Headache: Secondary | ICD-10-CM | POA: Insufficient documentation

## 2018-06-18 DIAGNOSIS — J449 Chronic obstructive pulmonary disease, unspecified: Secondary | ICD-10-CM | POA: Insufficient documentation

## 2018-06-18 DIAGNOSIS — M25551 Pain in right hip: Secondary | ICD-10-CM | POA: Diagnosis not present

## 2018-06-18 DIAGNOSIS — R0789 Other chest pain: Secondary | ICD-10-CM | POA: Insufficient documentation

## 2018-06-18 DIAGNOSIS — Y999 Unspecified external cause status: Secondary | ICD-10-CM | POA: Insufficient documentation

## 2018-06-18 DIAGNOSIS — R519 Headache, unspecified: Secondary | ICD-10-CM

## 2018-06-18 DIAGNOSIS — M542 Cervicalgia: Secondary | ICD-10-CM | POA: Insufficient documentation

## 2018-06-18 DIAGNOSIS — Z87891 Personal history of nicotine dependence: Secondary | ICD-10-CM | POA: Insufficient documentation

## 2018-06-18 DIAGNOSIS — Y9389 Activity, other specified: Secondary | ICD-10-CM | POA: Insufficient documentation

## 2018-06-18 DIAGNOSIS — Y9229 Other specified public building as the place of occurrence of the external cause: Secondary | ICD-10-CM | POA: Diagnosis not present

## 2018-06-18 LAB — CBC
HCT: 38.8 % (ref 36.0–46.0)
Hemoglobin: 12.9 g/dL (ref 12.0–15.0)
MCH: 30.3 pg (ref 26.0–34.0)
MCHC: 33.2 g/dL (ref 30.0–36.0)
MCV: 91.1 fL (ref 80.0–100.0)
Platelets: 281 10*3/uL (ref 150–400)
RBC: 4.26 MIL/uL (ref 3.87–5.11)
RDW: 12.5 % (ref 11.5–15.5)
WBC: 7.7 10*3/uL (ref 4.0–10.5)
nRBC: 0 % (ref 0.0–0.2)

## 2018-06-18 LAB — BASIC METABOLIC PANEL
ANION GAP: 7 (ref 5–15)
BUN: 26 mg/dL — ABNORMAL HIGH (ref 6–20)
CO2: 25 mmol/L (ref 22–32)
Calcium: 9.1 mg/dL (ref 8.9–10.3)
Chloride: 107 mmol/L (ref 98–111)
Creatinine, Ser: 0.87 mg/dL (ref 0.44–1.00)
GFR calc Af Amer: >60 mL/min (ref >60)
GFR calc non Af Amer: >60 mL/min (ref >60)
Glucose, Bld: 97 mg/dL (ref 70–99)
Potassium: 3.8 mmol/L (ref 3.5–5.1)
Sodium: 139 mmol/L (ref 135–145)

## 2018-06-18 LAB — TROPONIN I: Troponin I: <0.03 ng/mL (ref <0.03)

## 2018-06-18 MED ORDER — OXYCODONE-ACETAMINOPHEN 5-325 MG PO TABS
1.0000 | ORAL_TABLET | Freq: Once | ORAL | Status: AC
  Administered 2018-06-18: 1 via ORAL
  Filled 2018-06-18: qty 1

## 2018-06-18 MED ORDER — CYCLOBENZAPRINE HCL 10 MG PO TABS
5.0000 mg | ORAL_TABLET | Freq: Once | ORAL | Status: AC
  Administered 2018-06-18: 5 mg via ORAL
  Filled 2018-06-18: qty 1

## 2018-06-18 MED ORDER — IBUPROFEN 600 MG PO TABS
600.0000 mg | ORAL_TABLET | Freq: Once | ORAL | Status: AC
  Administered 2018-06-18: 600 mg via ORAL
  Filled 2018-06-18: qty 1
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
## 2018-08-03 ENCOUNTER — Ambulatory Visit: Payer: Medicare Other | Attending: Physical Medicine and Rehabilitation | Admitting: Physical Therapy

## 2018-08-03 ENCOUNTER — Other Ambulatory Visit: Payer: Self-pay

## 2018-08-03 ENCOUNTER — Encounter: Payer: Self-pay | Admitting: Physical Therapy

## 2018-08-03 DIAGNOSIS — R262 Difficulty in walking, not elsewhere classified: Secondary | ICD-10-CM | POA: Insufficient documentation

## 2018-08-03 DIAGNOSIS — M6281 Muscle weakness (generalized): Secondary | ICD-10-CM | POA: Insufficient documentation

## 2018-08-03 NOTE — Therapy (Signed)
[facility] MAIN [facility] SERVICES [address] [place], [place], [address] Phone: [phone]   Fax:  [phone]  Physical Therapy Evaluation  Patient Details  Name: Sandra Keller MRN: [id number] Date of Birth: 15-Dec-1960 Referring Provider (PT):   Asa Saunas   Encounter Date: 08/03/2018  PT End of Session - 08/03/18 1647    Visit Number  1    Number of Visits  17    Date for PT Re-Evaluation  09/28/18    PT Start Time  0445    PT Stop Time  0545    PT Time Calculation (min)  60 min    Activity Tolerance  Patient tolerated treatment well    Behavior During Therapy  [facility] for tasks assessed/performed       Past Medical History:  Diagnosis Date  . Allergy   . Anxiety   . COPD (chronic obstructive pulmonary disease) (HCC)   . Hearing loss   . Peripheral neuropathy RLE  . Risk for falls     Past Surgical History:  Procedure Laterality Date  . ABDOMINAL HYSTERECTOMY      There were no vitals filed for this visit.   Subjective Assessment - 08/03/18 1653    Subjective  Patient is not able to stand on her legs as long and cant sit for as long due to pain in back and BLE.    Pertinent History  Patient has an AFO on RLE 2013 . She ambulates with R AFO and ambulates without AD. She gets cortisone shots in her knees and is having a gel into her knees tomorrow. She had had peripheral neuropathy for 7 years. 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     Limitations  Walking    How long can you stand comfortably?  20 mins    Patient Stated Goals  Patient wants to be able to stand longer and walk longer.    Currently in Pain?  Yes    Pain Score  10-Worst pain ever    Pain Location  Knee    Pain Orientation  Right;Left    Pain Descriptors / Indicators  Aching    Pain Type  Chronic pain    Pain Onset  More than a month ago    Pain Frequency  Constant    Aggravating Factors   standing    Pain Relieving  Factors  shots    Effect of Pain on Daily Activities  difficult to care for her husband         [facility] PT Assessment - 08/03/18 1700      Assessment   Medical Diagnosis  foot drop    Referring Provider (PT)    Asa Saunas    Onset Date/Surgical Date  08/03/18    Hand Dominance  Right    Next MD Visit  08/19/18      Precautions   Required Braces or Orthoses  --   AFO RLE     Restrictions   Weight Bearing Restrictions  No      Balance Screen   Has the patient fallen in the past 6 months  No    Has the patient had a decrease in activity level because of a fear of falling?   Yes    Is the patient reluctant to leave their home because of a fear of falling?   No      Home Environment   Living Environment  Private residence    Living Arrangements  Spouse/significant other    Available Help at Discharge  Family    Type of Home  House    Home Access  Ramped entrance    Home Layout  One level    Home Equipment  Walker - 2 wheels;Cane - single point;Bedside commode;Shower seat;Grab bars - toilet      Prior Function   Level of Independence  Independent with basic ADLs;Independent with household mobility without device;Independent with gait;Independent with transfers    Leisure  rest        PAIN: Patient has pain in back and BLE from hip to feet that is intermittent  POSTURE: flexed trunk and hips with ambulation  PROM/AROM: BUE WNL , BLE WFL   STRENGTH:  Graded on a 0-5 scale Muscle Group Left Right                          Hip Flex 5/5 4/5  Hip Abd 5/5 4/5  Hip Add 5/5 4/5  Hip Ext 3/5 3/5  Hip IR/ER 5/5 4/5  Knee Flex 5/5 4/5  Knee Ext 5/5 4/5  Ankle DF 5/5 0/5  Ankle PF 5/5 0/5   SENSATION: intact BLE  FUNCTIONAL MOBILITY: independent with transfers with BUE and needs assist from lower surfaces   BALANCE: Static Standing Balance  Normal Able to maintain standing balance against maximal resistance   Good Able to maintain standing balance against  moderate resistance   Good-/Fair+ Able to maintain standing balance against minimal resistance x  Fair Able to stand unsupported without UE support and without LOB for 1-2 min   Fair- Requires Min A and UE support to maintain standing without loss of balance   Poor+ Requires mod A and UE support to maintain standing without loss of balance   Poor Requires max A and UE support to maintain standing balance without loss    Standing Dynamic Balance  Normal Stand independently unsupported, able to weight shift and cross midline maximally   Good Stand independently unsupported, able to weight shift and cross midline moderately x  Good-/Fair+ Stand independently unsupported, able to weight shift across midline minimally   Fair Stand independently unsupported, weight shift, and reach ipsilaterally, loss of balance when crossing midline   Poor+ Able to stand with Min A and reach ipsilaterally, unable to weight shift   Poor Able to stand with Mod A and minimally reach ipsilaterally, unable to cross midline.    GAIT: Patient ambulates without AD with deviated path and limp with decreased step length and decreased step height and RLE AFO  OUTCOME MEASURES: TEST Outcome Interpretation  5 times sit<>stand 17.35sec 58>60 yo, >15 sec indicates increased risk for falls  10 meter walk test    .87             m/s <1.0 m/s indicates increased risk for falls; limited community ambulator  Timed up and Go     15.63            sec <14 sec indicates increased risk for falls  6 minute walk test    900            Feet 1000 feet is community Financial controllerambulator  Berg Balance Assessment 56/56 <36/56 (100% risk for falls), 37-45 (80% risk for falls); 46-51 (>50% risk for falls); 52-55 (lower risk <25% of falls)  Objective measurements completed on examination: See above findings.              PT Education - 08/03/18 1705    Education Details  plan of care    Person(s) Educated  Patient     Methods  Explanation    Comprehension  Verbalized understanding       PT Short Term Goals - 08/03/18 1749      PT SHORT TERM GOAL #1   Title  Patient will be independent in home exercise program to improve strength/mobility for better functional independence with ADLs.    Time  4    Period  Weeks    Status  New    Target Date  08/31/18      PT SHORT TERM GOAL #2   Title  Patient (> 74 years old) will complete five times sit to stand test in < 15 seconds indicating an increased LE strength and improved balance.    Time  4    Period  Weeks    Status  New    Target Date  08/31/18        PT Long Term Goals - 08/03/18 1750      PT LONG TERM GOAL #1   Title  Patient will increase six minute walk test distance to >1000 for progression to community ambulator and improve gait ability    Time  8    Period  Weeks    Status  New    Target Date  09/28/18      PT LONG TERM GOAL #2   Title  Patient will increase 10 meter walk test to >1.38m/s as to improve gait speed for better community ambulation and to reduce fall risk.    Time  8    Period  Weeks    Status  New    Target Date  09/28/18      PT LONG TERM GOAL #3   Title  Patient will reduce timed up and go to <11 seconds to reduce fall risk and demonstrate improved transfer/gait ability.    Time  8    Period  Weeks    Status  New    Target Date  09/28/18             Plan - 08/03/18 1744    Clinical Impression Statement  Patient presents with Dx of foot drop. She has pain in BLE hips to feet 10/10 that is intermittent. She has decreased standing tolerance less than 20 mins, decreased gait speed with R AFO and decreased outcome measures with 5 x sit to stand and TUG and 10 MW test, 6 MW test. She will benefit from skilled PT to improve strength and decrease her pain and improve mobility.     Clinical Decision Making  Low    Rehab Potential  Good    PT Frequency  2x / week    PT Duration  8 weeks    PT  Treatment/Interventions  Aquatic Therapy;Clinical cytogeneticist;Therapeutic activities;Therapeutic exercise;Balance training;Patient/family education;Neuromuscular re-education;Orthotic Fit/Training    PT Next Visit Plan  strengthening of R ankle    PT Home Exercise Plan  heel raises    Consulted and Agree with Plan of Care  Patient       Patient will benefit from skilled therapeutic intervention in order to improve the following deficits and impairments:  Abnormal gait, Decreased endurance, Decreased mobility, Difficulty walking, Obesity, Decreased activity tolerance, Decreased strength, Pain  Visit Diagnosis: Difficulty in  walking, not elsewhere classified  Muscle weakness (generalized)     Problem List There are no active problems to display for this patient.   [address], Loralai Eisman S, [place] DPT 08/03/2018, 5:55 PM  [facility] [address] [place], [place], [address] Phone: [phone]   Fax:  [phone]  Name: Ileene PatrickSherrie D Sheffer MRN: [id number] Date of Birth: 09-07-1960

## 2018-08-08 ENCOUNTER — Ambulatory Visit: Payer: Medicare Other | Attending: Physical Medicine and Rehabilitation | Admitting: Physical Therapy

## 2018-08-08 ENCOUNTER — Encounter: Payer: Self-pay | Admitting: Physical Therapy

## 2018-08-08 DIAGNOSIS — R262 Difficulty in walking, not elsewhere classified: Secondary | ICD-10-CM | POA: Insufficient documentation

## 2018-08-08 DIAGNOSIS — M6281 Muscle weakness (generalized): Secondary | ICD-10-CM | POA: Diagnosis present

## 2018-08-08 NOTE — Therapy (Signed)
[facility] [address] [place], [place], [address] Phone: [phone]   Fax:  [phone]  Physical Therapy Treatment  Patient Details  Name: Sandra Keller MRN: [id number] Date of Birth: 09/16/1960 Referring Provider (PT):   Asa Saunasingraham Michael   Encounter Date: 08/08/2018  PT End of Session - 08/08/18 1634    Visit Number  2    Number of Visits  17    Date for PT Re-Evaluation  09/28/18    PT Start Time  0445    PT Stop Time  0530    PT Time Calculation (min)  45 min    Activity Tolerance  Patient tolerated treatment well    Behavior During Therapy  [facility] for tasks assessed/performed       Past Medical History:  Diagnosis Date  . Allergy   . Anxiety   . COPD (chronic obstructive pulmonary disease) (HCC)   . Hearing loss   . Peripheral neuropathy RLE  . Risk for falls     Past Surgical History:  Procedure Laterality Date  . ABDOMINAL HYSTERECTOMY      There were no vitals filed for this visit.  Subjective Assessment - 08/08/18 1635    Subjective  Patient is not having any pain. She has a  $40 co pay and wants a HEP today and then DC.    Pertinent History  Patient has an AFO on RLE 2013 . She ambulates with R AFO and ambulates without AD. She gets cortisone shots in her knees and is having a gel into her knees tomorrow. She had had peripheral neuropathy for 7 years. 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     Limitations  Walking    How long can you stand comfortably?  20 mins    Patient Stated Goals  Patient wants to be able to stand longer and walk longer.    Currently in Pain?  No/denies    Pain Score  0-No pain    Pain Onset  More than a month ago       Treatment: Prone hip extension with knee extension x 10 BLE sidelying hip abd x 10 BLE Standing hip abd with YTB x 10 BLE SAQ x 10 x 2 BLE, 3 sec hold Standing knee flex x 10 x 2  Ankle inv/eve, DF/PF x 10 Baps  board CCW, fwd/bwd taps, side to side taps x 20   Patient was given a written hand out for HEP                       PT Education - 08/08/18 1634    Education Details  HEP    Person(s) Educated  Patient    Methods  Explanation;Demonstration;Tactile cues;Verbal cues;Handout    Comprehension  Returned demonstration;Need further instruction       PT Short Term Goals - 08/03/18 1749      PT SHORT TERM GOAL #1   Title  Patient will be independent in home exercise program to improve strength/mobility for better functional independence with ADLs.    Time  4    Period  Weeks    Status  New    Target Date  08/31/18      PT SHORT TERM GOAL #2   Title  Patient (> 60 years old) will complete five times sit to stand test in < 15 seconds indicating an increased LE strength and  improved balance.    Time  4    Period  Weeks    Status  New    Target Date  08/31/18        PT Long Term Goals - 08/03/18 1750      PT LONG TERM GOAL #1   Title  Patient will increase six minute walk test distance to >1000 for progression to community ambulator and improve gait ability    Time  8    Period  Weeks    Status  New    Target Date  09/28/18      PT LONG TERM GOAL #2   Title  Patient will increase 10 meter walk test to >1.6m/s as to improve gait speed for better community ambulation and to reduce fall risk.    Time  8    Period  Weeks    Status  New    Target Date  09/28/18      PT LONG TERM GOAL #3   Title  Patient will reduce timed up and go to <11 seconds to reduce fall risk and demonstrate improved transfer/gait ability.    Time  8    Period  Weeks    Status  New    Target Date  09/28/18            Plan - 08/08/18 1702    Clinical Impression Statement  Pt requires direction and verbal cues for correct performance of exercises. Patient demonstrates poor strength in right ankle and performs open and closed chain exercises and reports no of pain. Pt was able to  perform all exercises with min VC for technique. Pt encouraged continuing HEP and will be DC due to high $40 co pay.     Rehab Potential  Good    PT Frequency  2x / week    PT Duration  8 weeks    PT Treatment/Interventions  Aquatic Therapy;Clinical cytogeneticist;Therapeutic activities;Therapeutic exercise;Balance training;Patient/family education;Neuromuscular re-education;Orthotic Fit/Training    PT Next Visit Plan  strengthening of R ankle    PT Home Exercise Plan  heel raises    Consulted and Agree with Plan of Care  Patient       Patient will benefit from skilled therapeutic intervention in order to improve the following deficits and impairments:  Abnormal gait, Decreased endurance, Decreased mobility, Difficulty walking, Obesity, Decreased activity tolerance, Decreased strength, Pain  Visit Diagnosis: Difficulty in walking, not elsewhere classified  Muscle weakness (generalized)     Problem List There are no active problems to display for this patient.   [address], [place] DPT 08/08/2018, 5:32 PM  [place] [facility] MAIN [facility] SERVICES [address] [place], [place], [address] Phone: [phone]   Fax:  [phone]  Name: Sandra Keller MRN: [id number] Date of Birth: 09/13/1960

## 2018-08-08 NOTE — Patient Instructions (Signed)
Hip Extension: 2-4 Inches    Tighten gluteal muscle. Lift one leg _10_ times. Restabilize pelvis. Repeat with other leg. Keep pelvis still. Be sure pelvis does not rotate and back does not arch. Do ___2 sets, _7__ times per day.  http://ss.exer.us/63   Copyright  VHI. All rights reserved.  Strengthening: Hip Abduction (Side-Lying)    Tighten muscles on front of left thigh, then lift leg _10___ inches from surface, keeping knee locked.  Repeat __2__ times per set. Do __2__ sets per session. Do __7__ sessions per day.  http://orth.exer.us/623   Copyright  VHI. All rights reserved.  Strengthening: Hip Abduction - Resisted    With tubing around right leg, other side toward anchor, extend leg out from side. Repeat _10___ times per set. Do ___2_ sets per session. Do _1___ sessions per day.  http://orth.exer.us/635   Copyright  VHI. All rights reserved.  Strengthening: Terminal Knee Extension (Supine)    With right knee over bolster, straighten knee by tightening muscles on top of thigh. Keep bottom of knee on bolster. Repeat __10__ times per set. Do _2___ sets per session. Do 1____ sessions per day.  http://orth.exer.us/627   Copyright  VHI. All rights reserved.  Strengthening: Knee Flexion (Standing)    With support 10  times per set. Do ____ 2sets per session. Do _1___ sessions per day.  http://orth.exer.us/629   Copyright  VHI. All rights reserved.  Ankle Inversion    With ankle movement only, move left foot so sole of foot faces inward. Repeat _10___ times per session. Do _7___ sessions per week. Position: Standing   Copyright  VHI. All rights reserved.  Ankle Dorsiflexion / Plantar Flexion    Raise left leg forward to comfortable height, knee straight. Pull foot up then push it down, moving at ankle only. Repeat sequence __10__ times per session. Do __7__ sessions per week. Position: Standing   Copyright  VHI. All rights reserved.  Ankle  Activities: Dorsiflexors    Lift toes up and use ankle movements to: Kick balloons off floor. Kick slowly using light balls of medium size. Progress to kicking faster or use heavier balls. Walk around house on heels. Pick a hallway in the house. Name it "Heels Hallway", and everyone in family has to walk on heels through this hallway. Play soccer.  Copyright  VHI. All rights reserved.

## 2018-08-10 ENCOUNTER — Ambulatory Visit: Payer: Medicare Other | Admitting: Physical Therapy

## 2018-08-15 ENCOUNTER — Ambulatory Visit: Payer: Medicare Other | Admitting: Physical Therapy

## 2018-08-17 ENCOUNTER — Ambulatory Visit: Payer: Medicare Other | Admitting: Physical Therapy

## 2018-08-22 ENCOUNTER — Ambulatory Visit: Payer: Medicare Other | Admitting: Physical Therapy

## 2018-08-24 ENCOUNTER — Ambulatory Visit: Payer: Medicare Other | Admitting: Physical Therapy

## 2018-08-29 ENCOUNTER — Ambulatory Visit: Payer: Medicare Other | Admitting: Physical Therapy

## 2018-08-31 ENCOUNTER — Ambulatory Visit: Payer: Medicare Other | Admitting: Physical Therapy

## 2018-09-05 ENCOUNTER — Ambulatory Visit: Payer: Medicare Other | Admitting: Physical Therapy

## 2018-09-07 ENCOUNTER — Ambulatory Visit: Payer: Medicare Other | Admitting: Physical Therapy

## 2018-09-12 ENCOUNTER — Ambulatory Visit: Payer: Medicare Other | Admitting: Physical Therapy

## 2018-09-14 ENCOUNTER — Ambulatory Visit: Payer: Medicare Other | Admitting: Physical Therapy

## 2018-09-19 ENCOUNTER — Ambulatory Visit: Payer: Medicare Other | Admitting: Physical Therapy

## 2018-09-21 ENCOUNTER — Ambulatory Visit: Payer: Medicare Other | Admitting: Physical Therapy

## 2018-09-26 ENCOUNTER — Ambulatory Visit: Payer: Medicare Other | Admitting: Physical Therapy

## 2018-09-28 ENCOUNTER — Ambulatory Visit: Payer: Medicare Other | Admitting: Physical Therapy

## 2018-10-03 ENCOUNTER — Ambulatory Visit: Payer: Medicare Other | Admitting: Physical Therapy

## 2018-10-05 ENCOUNTER — Ambulatory Visit: Payer: Medicare Other | Admitting: Physical Therapy
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
## 2022-03-23 ENCOUNTER — Other Ambulatory Visit: Payer: Self-pay

## 2022-03-23 ENCOUNTER — Emergency Department
Admission: EM | Admit: 2022-03-23 | Discharge: 2022-03-23 | Disposition: A | Discharge status: Home / Self Care | Payer: 59 | Attending: Emergency Medicine | Admitting: Emergency Medicine

## 2022-03-23 ENCOUNTER — Encounter: Payer: Self-pay | Admitting: Emergency Medicine

## 2022-03-23 DIAGNOSIS — B029 Zoster without complications: Secondary | ICD-10-CM | POA: Insufficient documentation

## 2022-03-23 DIAGNOSIS — J449 Chronic obstructive pulmonary disease, unspecified: Secondary | ICD-10-CM | POA: Diagnosis not present

## 2022-03-23 DIAGNOSIS — R21 Rash and other nonspecific skin eruption: Secondary | ICD-10-CM | POA: Diagnosis present

## 2022-03-23 MED ORDER — VALACYCLOVIR HCL 500 MG PO TABS
1000.0000 mg | ORAL_TABLET | Freq: Once | ORAL | Status: AC
  Administered 2022-03-23: 1000 mg via ORAL
  Filled 2022-03-23: qty 2

## 2022-03-23 MED ORDER — PREDNISONE 10 MG PO TABS: 10.0000 mg | ORAL_TABLET | Freq: Every day | ORAL | 0 refills | Status: AC

## 2022-03-23 MED ORDER — FLUORESCEIN SODIUM 1 MG OP STRP
1.0000 | ORAL_STRIP | Freq: Once | OPHTHALMIC | Status: AC
  Administered 2022-03-23: 1 via OPHTHALMIC
  Filled 2022-03-23: qty 1

## 2022-03-23 MED ORDER — TETRACAINE HCL 0.5 % OP SOLN
2.0000 [drp] | Freq: Once | OPHTHALMIC | Status: AC
  Administered 2022-03-23: 2 [drp] via OPHTHALMIC
  Filled 2022-03-23: qty 4

## 2022-03-23 MED ORDER — VALACYCLOVIR HCL 1 G PO TABS: 1000.0000 mg | ORAL_TABLET | Freq: Three times a day (TID) | ORAL | 0 refills | Status: AC

## 2022-03-23 MED ORDER — OXYCODONE-ACETAMINOPHEN 5-325 MG PO TABS: 1.0000 | ORAL_TABLET | ORAL | 0 refills | Status: AC | PRN

## 2022-03-23 MED ORDER — PREDNISONE 20 MG PO TABS
60.0000 mg | ORAL_TABLET | Freq: Once | ORAL | Status: AC
  Administered 2022-03-23: 60 mg via ORAL
  Filled 2022-03-23: qty 3

## 2022-03-23 MED ORDER — OXYCODONE-ACETAMINOPHEN 5-325 MG PO TABS
1.0000 | ORAL_TABLET | Freq: Once | ORAL | Status: AC
  Administered 2022-03-23: 1 via ORAL
  Filled 2022-03-23: qty 1

## 2022-03-23 NOTE — Discharge Instructions (Addendum)
Please take your medications as prescribed.  Please call the number provided for ophthalmology today to arrange a follow-up appointment for evaluation tomorrow.  Please inform them that you were seen in the emergency department and told to follow-up tomorrow 03/24/2022 with Dr. George Ina.

## 2022-03-23 NOTE — ED Triage Notes (Signed)
Pt comes with c/o shingles.pt sent over from [facility] to get evaluated. Pt states rash to left eye and pain and swelling. Pt states this started this past weekend.

## 2022-03-23 NOTE — ED Provider Notes (Signed)
[facility] Provider Note    Event Date/Time   First MD Initiated Contact with Patient 03/23/22 1127     (approximate)  History   Chief Complaint: Herpes Zoster  HPI  Sandra Keller is a 61 y.o. female with a past medical history of anxiety, COPD, neuropathy, presents to the emergency department for a rash and discomfort to her left face.  According to the patient over the weekend she developed discomfort tingling and burning to her left face and around her left eye.  States she then developed blisters and a rash to the left face around the left eye and into the left scalp.  Patient went to the walk-in clinic today and was referred to the emergency department for evaluation.  Denies any visual changes but does states some burning or discomfort in the left eye.  Physical Exam   Triage Vital Signs: ED Triage Vitals  Enc Vitals Group     BP 03/23/22 1051 130/88     Pulse Rate 03/23/22 1051 88     Resp 03/23/22 1051 18     Temp 03/23/22 1051 98 F (36.7 C)     Temp src --      SpO2 03/23/22 1051 100 %     Weight 03/23/22 1251 192 lb 0.3 oz (87.1 kg)     Height 03/23/22 1251 5\' 4"  (1.626 m)     Head Circumference --      Peak Flow --      Pain Score 03/23/22 1048 10     Pain Loc --      Pain Edu? --      Excl. in [place]? --     Most recent vital signs: Vitals:   03/23/22 1051 03/23/22 1252  BP: 130/88 138/80  Pulse: 88 80  Resp: 18 18  Temp: 98 F (36.7 C)   SpO2: 100% 99%    General: Awake, no distress.  CV:  Good peripheral perfusion.  Regular rate and rhythm  Resp:  Normal effort.  Equal breath sounds bilaterally.  Abd:  No distention.  Soft, nontender.  No rebound or guarding. Other:  Patient has small vesicular rash to the left forehead left nose and just lateral to the left eye consistent with herpes zoster.  Does not cross the midline.   ED Results / Procedures / Treatments    MEDICATIONS ORDERED IN ED: Medications   oxyCODONE-acetaminophen (PERCOCET/ROXICET) 5-325 MG per tablet 1 tablet (has no administration in time range)  predniSONE (DELTASONE) tablet 60 mg (60 mg Oral Given 03/23/22 1214)  valACYclovir (VALTREX) tablet 1,000 mg (1,000 mg Oral Given 03/23/22 1215)  tetracaine (PONTOCAINE) 0.5 % ophthalmic solution 2 drop (2 drops Left Eye Given 03/23/22 1216)  fluorescein ophthalmic strip 1 strip (1 strip Left Eye Given 03/23/22 1216)     IMPRESSION / MDM / ASSESSMENT AND PLAN / ED COURSE  I reviewed the triage vital signs and the nursing notes.  Patient's presentation is most consistent with acute presentation with potential threat to life or bodily function.  Patient presents emergency department for rash and burning to the left face and around the left eye.  Patient states discomfort of burning in the left eye at times.  Rashes consistent with herpes zoster we will start the patient on valacyclovir 3 times daily and prednisone for the next 7 days.  Given eye discomfort I used tetracaine and fluorescein to evaluate the eye with a Woods lamp, no dendritic pattern or other concerning  findings on my evaluation.  We will discuss with ophthalmology for further intervention.  Patient agreeable to plan.  I spoke with Dr. Druscilla Brownie of ophthalmology will follow-up with the patient tomorrow in the office.  States no further treatment needed from his standpoint as he will closely follow the patient in the office.  We will start the patient on valacyclovir, prednisone we will also prescribe a short course of pain medication for the patient.  Patient agreeable to plan of care.  FINAL CLINICAL IMPRESSION(S) / ED DIAGNOSES   Herpes zoster   Note:  This document was prepared using Dragon voice recognition software and may include unintentional dictation errors.   Minna Antis, MD 03/23/22 1315
